# Patient Record
Sex: Female | Born: 1950 | Race: Black or African American | Hispanic: No | Smoking: Never smoker
Health system: Southern US, Community
[De-identification: ages and names within clinical notes are randomized; demographics above are authoritative.]

## PROBLEM LIST (undated history)

## (undated) DIAGNOSIS — L509 Urticaria, unspecified: Secondary | ICD-10-CM

## (undated) DIAGNOSIS — E559 Vitamin D deficiency, unspecified: Secondary | ICD-10-CM

## (undated) DIAGNOSIS — E669 Obesity, unspecified: Secondary | ICD-10-CM

## (undated) DIAGNOSIS — D649 Anemia, unspecified: Secondary | ICD-10-CM

## (undated) DIAGNOSIS — K219 Gastro-esophageal reflux disease without esophagitis: Secondary | ICD-10-CM

## (undated) DIAGNOSIS — E785 Hyperlipidemia, unspecified: Secondary | ICD-10-CM

## (undated) DIAGNOSIS — Z780 Asymptomatic menopausal state: Secondary | ICD-10-CM

## (undated) HISTORY — DX: Vitamin D deficiency, unspecified: E55.9

## (undated) HISTORY — PX: TONSILLECTOMY: SUR1361

## (undated) HISTORY — PX: COLONOSCOPY: SHX174

## (undated) HISTORY — DX: Obesity, unspecified: E66.9

## (undated) HISTORY — DX: Asymptomatic menopausal state: Z78.0

## (undated) HISTORY — DX: Hyperlipidemia, unspecified: E78.5

## (undated) HISTORY — PX: KNEE SURGERY: SHX244

## (undated) HISTORY — PX: APPENDECTOMY: SHX54

## (undated) HISTORY — PX: CYSTECTOMY: SUR359

## (undated) HISTORY — DX: Gastro-esophageal reflux disease without esophagitis: K21.9

## (undated) HISTORY — PX: WISDOM TOOTH EXTRACTION: SHX21

## (undated) HISTORY — DX: Anemia, unspecified: D64.9

## (undated) HISTORY — DX: Urticaria, unspecified: L50.9

---

## 1998-09-16 ENCOUNTER — Other Ambulatory Visit: Admission: RE | Admit: 1998-09-16 | Discharge: 1998-09-16 | Payer: Self-pay | Admitting: Obstetrics and Gynecology

## 1999-10-27 ENCOUNTER — Other Ambulatory Visit: Admission: RE | Admit: 1999-10-27 | Discharge: 1999-10-27 | Payer: Self-pay | Admitting: Family Medicine

## 1999-12-07 ENCOUNTER — Encounter: Admission: RE | Admit: 1999-12-07 | Discharge: 1999-12-07 | Payer: Self-pay | Admitting: Family Medicine

## 1999-12-07 ENCOUNTER — Encounter: Payer: Self-pay | Admitting: Family Medicine

## 2000-01-03 ENCOUNTER — Ambulatory Visit (HOSPITAL_COMMUNITY): Admission: RE | Admit: 2000-01-03 | Discharge: 2000-01-03 | Payer: Self-pay | Admitting: Gastroenterology

## 2000-12-12 ENCOUNTER — Encounter: Admission: RE | Admit: 2000-12-12 | Discharge: 2000-12-12 | Payer: Self-pay | Admitting: Family Medicine

## 2000-12-12 ENCOUNTER — Encounter: Payer: Self-pay | Admitting: Family Medicine

## 2002-01-15 ENCOUNTER — Encounter: Payer: Self-pay | Admitting: Family Medicine

## 2002-01-15 ENCOUNTER — Encounter: Admission: RE | Admit: 2002-01-15 | Discharge: 2002-01-15 | Payer: Self-pay | Admitting: Family Medicine

## 2003-01-13 ENCOUNTER — Encounter: Admission: RE | Admit: 2003-01-13 | Discharge: 2003-04-13 | Payer: Self-pay | Admitting: *Deleted

## 2003-02-10 ENCOUNTER — Encounter: Payer: Self-pay | Admitting: *Deleted

## 2003-02-10 ENCOUNTER — Encounter: Admission: RE | Admit: 2003-02-10 | Discharge: 2003-02-10 | Payer: Self-pay | Admitting: *Deleted

## 2004-03-31 ENCOUNTER — Encounter: Admission: RE | Admit: 2004-03-31 | Discharge: 2004-03-31 | Payer: Self-pay | Admitting: Internal Medicine

## 2004-07-09 ENCOUNTER — Emergency Department (HOSPITAL_COMMUNITY): Admission: EM | Admit: 2004-07-09 | Discharge: 2004-07-09 | Payer: Self-pay | Admitting: Emergency Medicine

## 2005-06-14 ENCOUNTER — Encounter: Admission: RE | Admit: 2005-06-14 | Discharge: 2005-06-14 | Payer: Self-pay | Admitting: Internal Medicine

## 2006-06-10 ENCOUNTER — Emergency Department (HOSPITAL_COMMUNITY): Admission: EM | Admit: 2006-06-10 | Discharge: 2006-06-10 | Payer: Self-pay | Admitting: Family Medicine

## 2006-06-20 ENCOUNTER — Encounter: Admission: RE | Admit: 2006-06-20 | Discharge: 2006-06-20 | Payer: Self-pay | Admitting: Internal Medicine

## 2006-08-25 ENCOUNTER — Emergency Department (HOSPITAL_COMMUNITY): Admission: EM | Admit: 2006-08-25 | Discharge: 2006-08-25 | Payer: Self-pay | Admitting: Emergency Medicine

## 2006-08-27 ENCOUNTER — Ambulatory Visit: Payer: Self-pay | Admitting: Gastroenterology

## 2006-10-01 ENCOUNTER — Ambulatory Visit: Payer: Self-pay | Admitting: Gastroenterology

## 2007-02-20 ENCOUNTER — Emergency Department (HOSPITAL_COMMUNITY): Admission: EM | Admit: 2007-02-20 | Discharge: 2007-02-20 | Payer: Self-pay | Admitting: Emergency Medicine

## 2007-06-25 ENCOUNTER — Encounter: Admission: RE | Admit: 2007-06-25 | Discharge: 2007-06-25 | Payer: Self-pay | Admitting: Obstetrics and Gynecology

## 2007-07-14 ENCOUNTER — Emergency Department (HOSPITAL_COMMUNITY): Admission: EM | Admit: 2007-07-14 | Discharge: 2007-07-14 | Payer: Self-pay | Admitting: Family Medicine

## 2007-11-19 ENCOUNTER — Ambulatory Visit (HOSPITAL_COMMUNITY): Admission: RE | Admit: 2007-11-19 | Discharge: 2007-11-19 | Payer: Self-pay | Admitting: Specialist

## 2008-04-09 ENCOUNTER — Ambulatory Visit (HOSPITAL_COMMUNITY): Admission: RE | Admit: 2008-04-09 | Discharge: 2008-04-09 | Payer: Self-pay | Admitting: Internal Medicine

## 2008-06-25 ENCOUNTER — Encounter: Admission: RE | Admit: 2008-06-25 | Discharge: 2008-06-25 | Payer: Self-pay | Admitting: Internal Medicine

## 2008-07-31 IMAGING — CT CT HEAD W/O CM
4 series · 18 of 40 positions shown, 19 images · non-contrast
Comparison: none

CLINICAL DATA: Fell and hit head.  
 HEAD CT WITHOUT CONTRAST ? 02/20/07:
TECHNIQUE: Contiguous axial CT images were obtained from the base of the skull through the vertex according to standard protocol without contrast.   
 No comparison.
TECHNIQUE: Multidetector CT imaging of the cervical spine was performed according to standard protocol. Multiplanar CT image reconstructions were also generated. 

[Series 2: trauma head · axial · 0.47mm/px · z∈[-103,-6]mm · 4 of 32 slices shown, 5 images]
[im 7/32  brain]
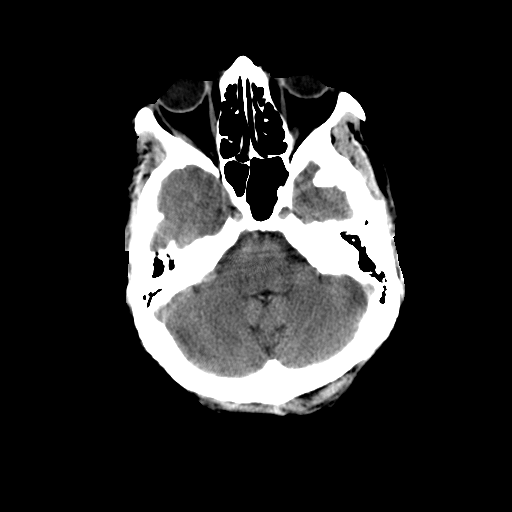
[im 7/32  bone]
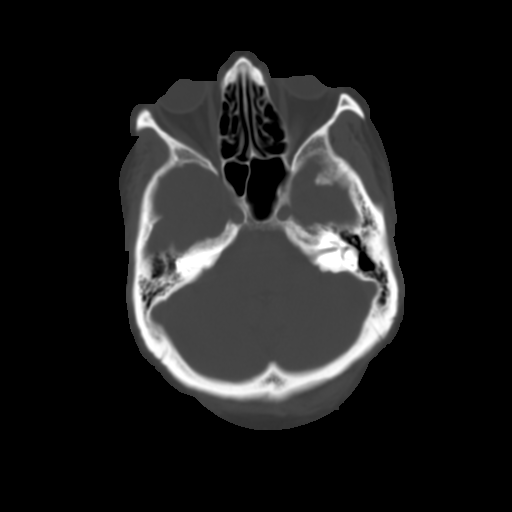
[im 13/32  brain]
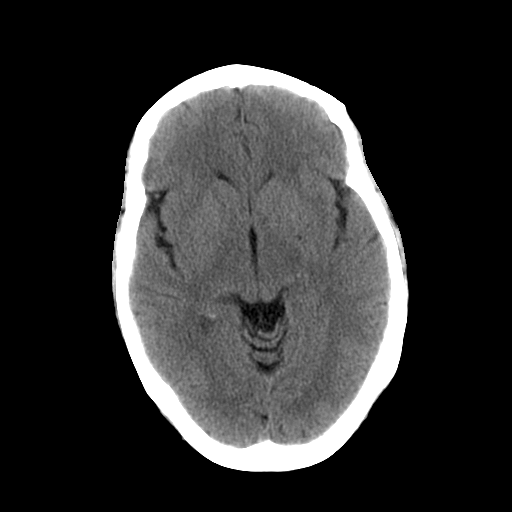
[im 19/32  brain]
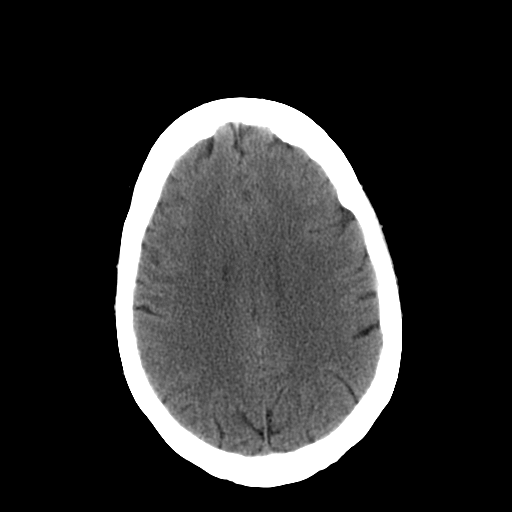
[im 25/32  brain]
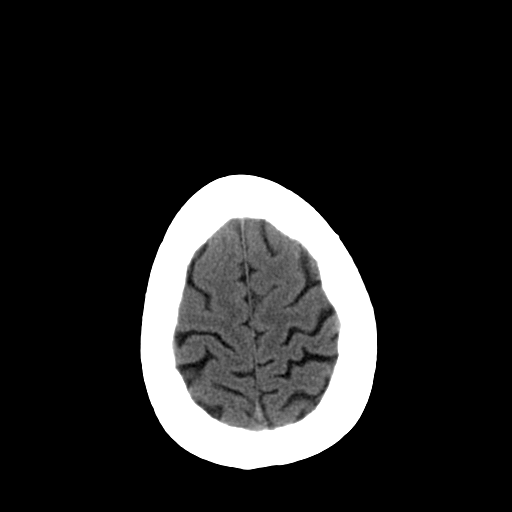

[Series 4: helical c-spine · axial · 0.27mm/px · z∈[-294,-259]mm · 3 of 59 slices shown]
[im 6/59  brain]
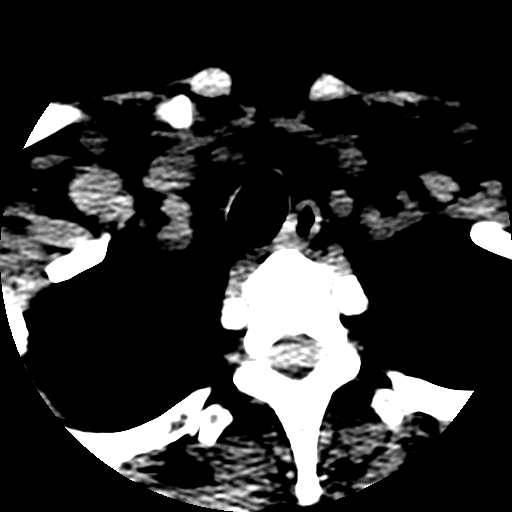
[im 12/59  brain]
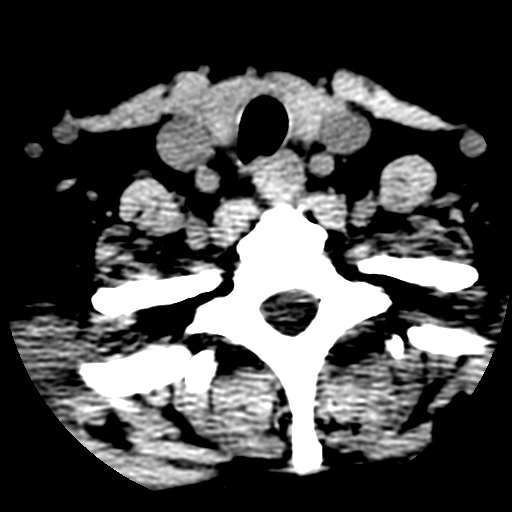
[im 18/59  brain]
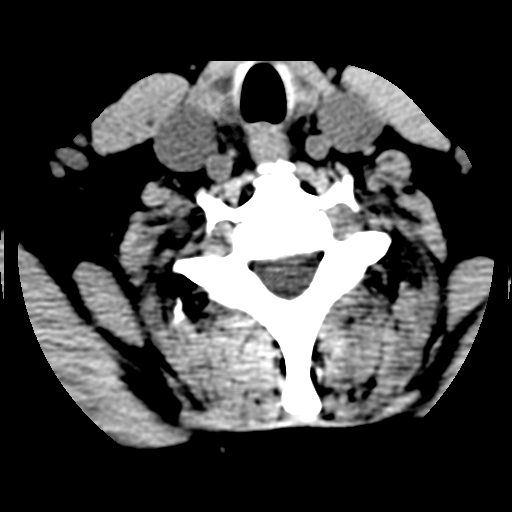

[Series 106: reformatted · coronal · 0.30mm/px · 3 of 67 slices shown (1 of 2)]
[im 23/67  brain]
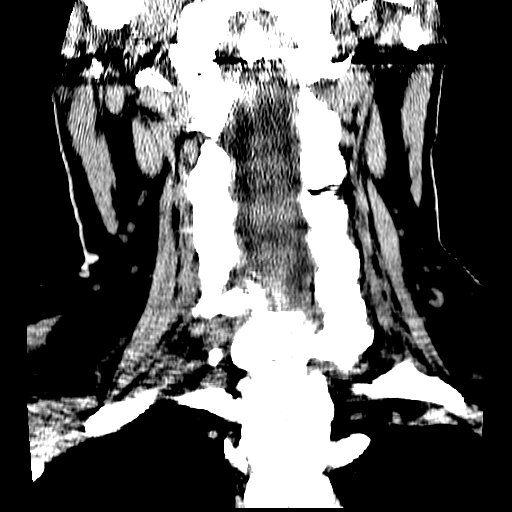
[im 30/67  brain]
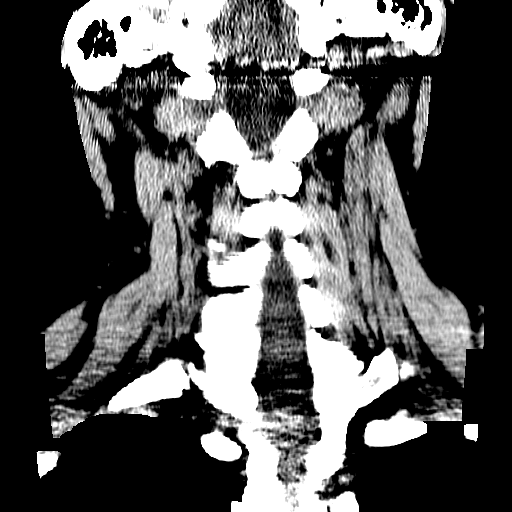
[im 37/67  brain]
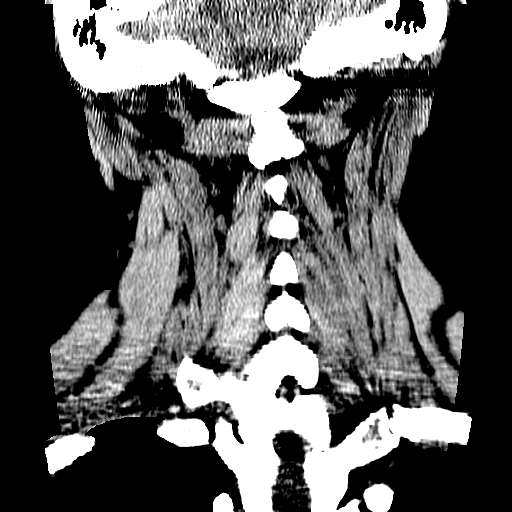

[Series 107: reformatted · sagittal · 0.30mm/px · 8 of 67 slices shown (2 of 2)]
[im 6/67  brain]
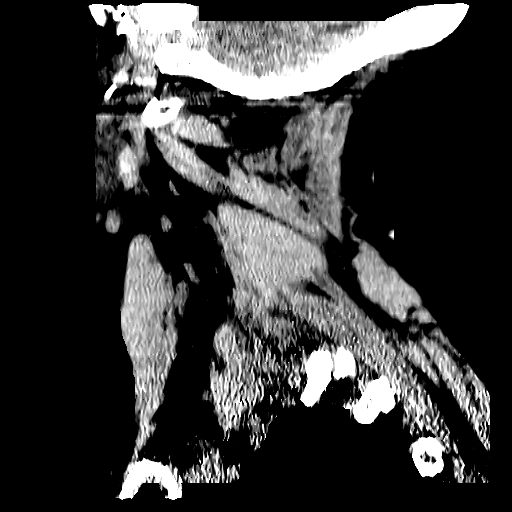
[im 17/67  brain]
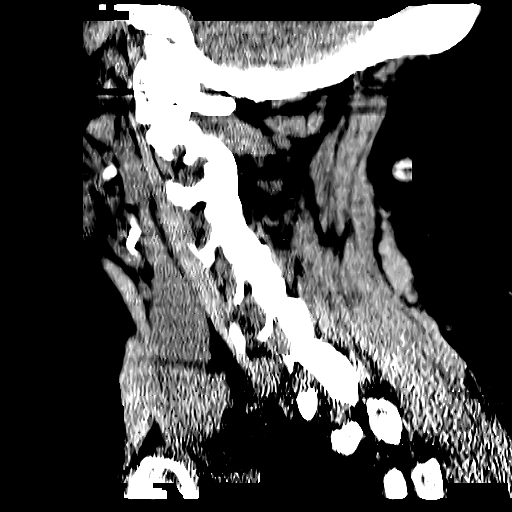
[im 23/67  brain]
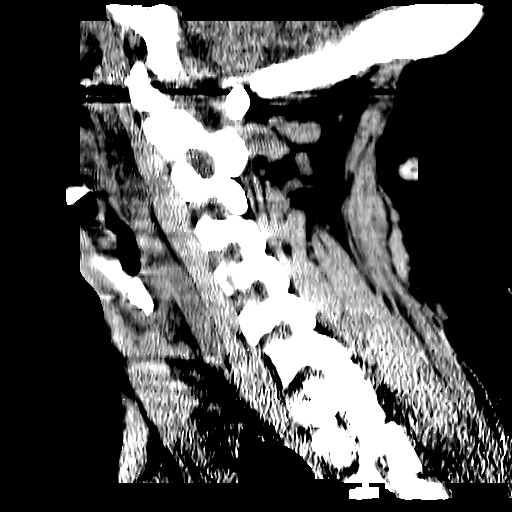
[im 28/67  brain]
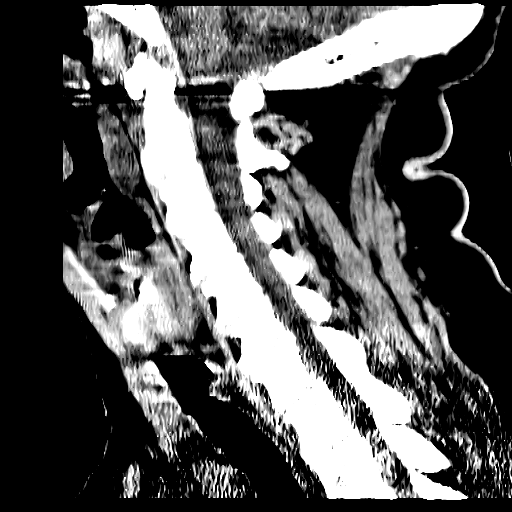
[im 39/67  brain]
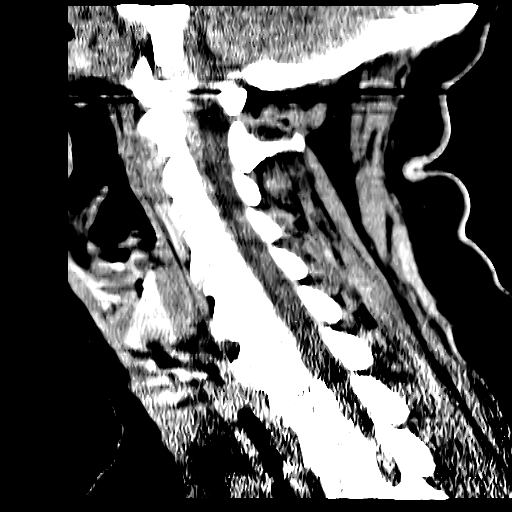
[im 45/67  brain]
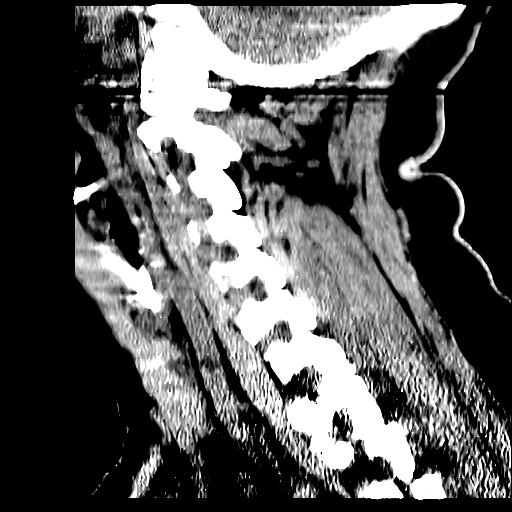
[im 50/67  brain]
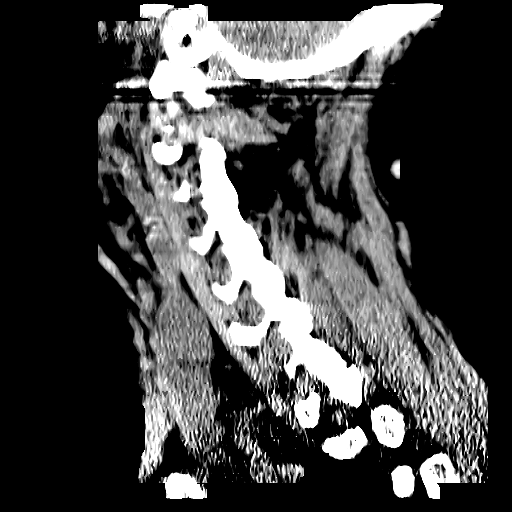
[im 61/67  brain]
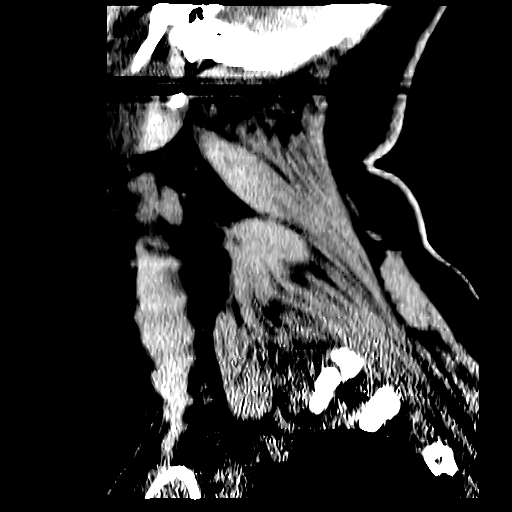

[18 of 40 positions shown; findings below may reference images not displayed]

FINDINGS: There is no evidence of intracranial hemorrhage, brain edema, acute infarct, mass lesion, or mass effect.  No other intra-axial abnormalities are seen, and the ventricles are within normal limits.  No abnormal extra-axial fluid collections or masses are identified.  No skull abnormalities are noted.
IMPRESSION: Negative non-contrast head CT.
 CERVICAL SPINE CT WITHOUT CONTRAST ? 02/20/07:
FINDINGS: Normal alignment and no fracture.   The disk spaces are well maintained.  No significant spondylosis is identified.  There is mild anterior spurring at C5-6, C6-7, and C1-2.
IMPRESSION: No acute abnormality.

## 2009-06-27 ENCOUNTER — Encounter: Admission: RE | Admit: 2009-06-27 | Discharge: 2009-06-27 | Payer: Self-pay | Admitting: Internal Medicine

## 2010-04-07 ENCOUNTER — Ambulatory Visit: Payer: Self-pay | Admitting: Family Medicine

## 2010-04-20 ENCOUNTER — Ambulatory Visit: Payer: Self-pay | Admitting: Family Medicine

## 2010-05-18 ENCOUNTER — Ambulatory Visit: Payer: Self-pay | Admitting: Family Medicine

## 2010-06-26 ENCOUNTER — Ambulatory Visit: Payer: Self-pay | Admitting: Family Medicine

## 2010-06-29 ENCOUNTER — Encounter: Admission: RE | Admit: 2010-06-29 | Discharge: 2010-06-29 | Payer: Self-pay | Admitting: Family Medicine

## 2010-06-29 LAB — HM MAMMOGRAPHY: HM Mammogram: NEGATIVE

## 2010-07-25 ENCOUNTER — Ambulatory Visit: Payer: Self-pay | Admitting: Family Medicine

## 2010-08-25 ENCOUNTER — Encounter: Payer: Self-pay | Admitting: Gastroenterology

## 2010-11-01 ENCOUNTER — Ambulatory Visit: Payer: Self-pay | Admitting: Family Medicine

## 2010-12-28 NOTE — Letter (Signed)
Summary: Colonoscopy-Changed to Office Visit Letter   Gastroenterology  [address] [place], [place] [address]   Phone: [phone]  Fax: [phone]      August 25, 2010 MRN: [id number]   [place] [address] [place], [place]  [address]   Dear Ms. Edelman,   According to our records, it is time for you to schedule a Colonoscopy. However, after reviewing your medical record, I feel that an office visit would be most appropriate to more completely evaluate you and determine your need for a repeat procedure.  Please call [phone] (option #2) at your convenience to schedule an office visit. If you have any questions, concerns, or feel that this letter is in error, we would appreciate your call.   Sincerely,  Rachael Fee, M.D.  [facility]astroenter[facility] [phone]

## 2011-04-10 NOTE — Op Note (Signed)
NAME:  Barbara [name], Barbara [name]              ACCOUNT NO.:  [id number]   MEDICAL RECORD NO.:  [id number]          PATIENT TYPE:  AMB   LOCATION:  DAY                          FACILITY:  [facility]   PHYSICIAN:  Jene Every, M.D.    DATE OF BIRTH:  06/19/1951   DATE OF PROCEDURE:  11/19/2007  DATE OF DISCHARGE:                               OPERATIVE REPORT   PREOPERATIVE DIAGNOSIS:  Lateral meniscal tear right knee.   POSTOPERATIVE DIAGNOSIS:  Lateral meniscal tear right knee. Extensive  grade 3 changes of the femoral condyle laterally posterior medial  meniscus tear.   PROCEDURE PERFORMED:  Right knee arthroscopy, partial lateral  meniscectomy and partial medial meniscectomy, chondroplasty of femoral  condyle.   ANESTHESIA:  General.   ASSISTANT:  No assistant.   BRIEF HISTORY:  The patient is a 60 year old with refractory knee pain.  MRI indicating significant degenerative changes of the lateral  compartment, lateral meniscus tear displaced anteriorly. Indicated for  diagnosis, treatment and debridement and partial meniscectomy. The risks  and benefits discussed of bleeding, infection, damage to vascular  structures, __________  need for debridement, etc.   The patient was placed in the supine position after induction of general  anesthesia and 400 mg of  IV Cipro. The right lower extremity was  prepped and draped in the usual sterile fashion. A lateral parapatellar  portal and the superior medial parapatellar portals were fashioned. A  #11 blade. __________  utilized and displaced the joint under excellent  sedation. Medial parapatellar portal was fashioned with the #11 blade  after immobilization with an 18 gauge needle sparing the  medial  meniscus.  Noted as a displaced anterior horn lateral meniscus tear.  Basket rongeurs utilized to resect this and this was retrieved with a  __________  shaver. Further contoured onto the midway portion of the  meniscus. This was further contoured  with an ArthroWand. There were  extensive grade 3 changes of the femoral condyle and grade 4 changes of  chondral flap tear and loose cartilaginous debris.  Chondroplasty was  performed here. The loose cartilage was debrided and removed.  The  remainder of the meniscus was stable to probe palpation, but  significantly deficient anteriorly.  Mild degenerative changes of the  ACL and this was debrided. The medial compartment revealed posterior  small radial tears of the  medial meniscus. This was freed with a  straight basket rongeur. This was resected with a straight basket  rongeur. The remainder was stable to probe palpation and the  patellofemoral joint with mild degenerative changes. Normal  patellofemoral tracking. The gutters were unremarkable. I re-examined  the medial compartment. Extensive weight bearing grade 3 and grade 4  changes were still noted. It was felt that will require total knee  arthroplasty in the future at this point.   Again, the knee was copiously lavaged. The lateral meniscus was re-  evaluated and it was stable to palpation. All  instrumentation was  removed. Portals were closed with 4-0 and running simple sutures.  Quarter percent Marcaine  with Epinephrine was infiltrated in the joint. The wound was  dressed  sterilely. She was awakened without difficulty and transported to the  recovery room in satisfactory condition. The patient tolerated the  procedure well. There were no complications.      Jene Every, M.D.  Electronically Signed     JB/MEDQ  D:  11/19/2007  T:  11/19/2007  Job:  [id number]

## 2011-04-13 NOTE — Assessment & Plan Note (Signed)
[facility]                           GASTROENTEROLOGY OFFICE NOTE   NAME:Barbara [name], Barbara [name]                       MRN:          [id number]  DATE:08/27/2006                            DOB:          1951-09-20    New GI patient evaluation. The patient was self-referred. Her primary care  doctor is Dr. Ambrose Mantle.   CHIEF COMPLAINT:  My doctor felt something in my rectum.   HISTORY OF PRESENT ILLNESS:  Barbara [name] is a pleasant 60 year old woman who  tells me she underwent a colonoscopy one year ago by Dr. Vilinda Boehringer at  [facility]. She believes this was normal. She for the past year  or so has had two to three loose stools on a daily basis with some mild  cramping with her bowel movements. She tells me she underwent physical exam  by her primary care doctor, and they told her they felt a mass in her  rectum. She was told to get this evaluated. She has had issues with billing  at [facility],and therefore wanted to switch providers.   We have asked for the records from [facility] to be sent over.  They are unable to locate her chart at this time. I find no dictation of a  colonoscopy in the echart system and no pathology reports in echart, either.   REVIEW OF SYSTEMS:  As notable for 4- to 5-pound weight gain in the past  year. No hematochezia. No melena. The rest of her review of systems is  essentially normal and is available on her nursing intake sheet.   PAST MEDICAL HISTORY:  1. Type 2 diabetes.  2. Elevated cholesterol.  3. Obesity.   CURRENT MEDICATIONS:  1. Metformin.  2. Amaryl.   ALLERGIES:  No known drug allergies.   SOCIAL HISTORY:  Divorced, two children, nonsmoker, nondrinker.   FAMILY HISTORY:  Her brother died at age 54 from colon cancer. Sister is in  her 78s with colon cancer.   PHYSICAL EXAMINATION:  VITAL SIGNS:  5 feet 4 inches, 209 pounds. Blood  pressure 114/68, pulse 72.  CONSTITUTIONAL:  Obese, otherwise generally well appearing.  NEUROLOGICAL:  Alert and oriented x3.  HEENT:  Eyes:  Extraocular movements intact. Mouth:  Oropharynx moist. No  lesions.  NECK:  Supple with no lymphadenopathy.  CARDIOVASCULAR:  Heart regular rate and rhythm.  LUNGS:  Clear to auscultation bilaterally.  ABDOMEN:  Soft, nontender, nondistended. Normal bowel sounds.  EXTREMITIES:  No lower extremity edema.  SKIN:  No rashes or lesions of distal extremities.  RECTAL:  Examination performed with female CMA in room was normal. No masses  palpated.   ASSESSMENT AND PLAN:  A 60 year old woman with question rectal mass on  recent exam by other Alonza Knisley, strong family history of colon cancer, recent  loose stools.   First, her relatively loose stool may be from her diabetes medicine. She is  on 2 g of metformin daily, and this is not an uncommon side effect of  metformin at this dose. Recommend that she discuss this with her primary  care physician and consider  cutting back the metformin. I will leave this to  him since obviously her other diabetes medicines will be have to be  adjusted.   As far as this question of a rectal mass, I did not feel anything on my  rectal examination. However, she tells me very clearly that another care  Lileigh Fahringer recently did feel something. Given her very strong family history,  I think we should rescope her at her soonest convenience. I am also having  trouble getting her colonoscopy report sent over from [facility]. We will continue to try to do this but will in the  meantime plan for a colonoscopy to be done at her soonest convenience. I  will also get CBC and complete metabolic profile performed today.            ______________________________  Rachael Fee, MD      DPJ/MedQ  DD:  08/27/2006  DT:  08/28/2006  Job #:  [id number]

## 2011-06-04 ENCOUNTER — Other Ambulatory Visit: Payer: Self-pay | Admitting: Family Medicine

## 2011-06-04 DIAGNOSIS — Z1231 Encounter for screening mammogram for malignant neoplasm of breast: Secondary | ICD-10-CM

## 2011-07-02 ENCOUNTER — Ambulatory Visit
Admission: RE | Admit: 2011-07-02 | Discharge: 2011-07-02 | Disposition: A | Discharge status: Home / Self Care | Payer: 59 | Source: Ambulatory Visit | Attending: Family Medicine | Admitting: Family Medicine

## 2011-07-02 DIAGNOSIS — Z1231 Encounter for screening mammogram for malignant neoplasm of breast: Secondary | ICD-10-CM

## 2011-08-24 ENCOUNTER — Encounter: Payer: Self-pay | Admitting: Gastroenterology

## 2011-08-31 LAB — BASIC METABOLIC PANEL
BUN: 13
CO2: 28
Calcium: 8.7
Chloride: 110
GFR calc non Af Amer: >60
Glucose, Bld: 152 — ABNORMAL HIGH

## 2011-08-31 LAB — URINALYSIS, ROUTINE W REFLEX MICROSCOPIC
Glucose, UA: NEGATIVE
Ketones, ur: NEGATIVE
pH: 5.5

## 2011-08-31 LAB — URINE MICROSCOPIC-ADD ON

## 2011-08-31 LAB — HEMOGLOBIN AND HEMATOCRIT, BLOOD: HCT: 31.3 — ABNORMAL LOW

## 2011-09-25 ENCOUNTER — Encounter: Payer: Self-pay | Admitting: Gastroenterology

## 2011-09-25 ENCOUNTER — Ambulatory Visit (INDEPENDENT_AMBULATORY_CARE_PROVIDER_SITE_OTHER): Payer: 59 | Admitting: Gastroenterology

## 2011-09-25 DIAGNOSIS — Z809 Family history of malignant neoplasm, unspecified: Secondary | ICD-10-CM | POA: Insufficient documentation

## 2011-09-25 MED ORDER — PEG-KCL-NACL-NASULF-NA ASC-C 100 G PO SOLR: 1.0000 | ORAL | Status: DC

## 2011-09-25 NOTE — Patient Instructions (Addendum)
You will be set up for a colonoscopy at [facility] with propofol due to incomplete procedure in 2007.  Please arrive at 11 am on 11/21/11 at [facility] for a pre appointment with anesthesia.

## 2011-09-25 NOTE — Progress Notes (Signed)
  HPI: This is a  very pleasant 60 year old woman whom I last saw in 2007.  She had colonoscopy 2006 (edwards) that was normal.  PCP felt a lump in her rectum in 2007, I repeated lower endoscopy in 2007 (flex sigmoidoscopy) that was normal (had very tortuous colon, could not pass into proximal colon).   No real bowel troubles.  Continued relatively loose stools with metformin.  No serious medical issues in the past several years.  Overall she has lost a few pounds in the past year, intentionally.   Review of systems: Pertinent positive and negative review of systems were noted in the above HPI section. Complete review of systems was performed and was otherwise normal.    Past Medical History  Diagnosis Date  . Diabetes mellitus   . Hyperlipidemia   . Obesity     Past Surgical History  Procedure Date  . Appendectomy   . Cystectomy     Rt falopian tube    Current Outpatient Prescriptions  Medication Sig Dispense Refill  . metFORMIN (GLUMETZA) 1000 MG (MOD) 24 hr tablet Take 1,000 mg by mouth 2 (two) times daily with a meal.          Allergies as of 09/25/2011  . (No Known Allergies)    Family History  Problem Relation Age of Onset  . Colon cancer Brother 22  . Colon cancer Sister 57  . Heart disease Mother   . Heart disease Father     History   Social History  . Marital Status: Divorced    Spouse Name: N/A    Number of Children: 2  . Years of Education: N/A   Occupational History  . School Midwife    Social History Main Topics  . Smoking status: Never Smoker   . Smokeless tobacco: Never Used  . Alcohol Use: No  . Drug Use: No  . Sexually Active: Not on file   Other Topics Concern  . Not on file   Social History Narrative  . No narrative on file       Physical Exam: BP 106/68  Pulse 80  Ht 5\' 4"  (1.626 m)  Wt 222 lb (100.699 kg)  BMI 38.11 kg/m2 Constitutional: generally well-appearing Psychiatric: alert and oriented x3 Eyes:  extraocular movements intact Mouth: oral pharynx moist, no lesions Neck: supple no lymphadenopathy Cardiovascular: heart regular rate and rhythm Lungs: clear to auscultation bilaterally Abdomen: soft, nontender, nondistended, no obvious ascites, no peritoneal signs, normal bowel sounds Extremities: no lower extremity edema bilaterally Skin: no lesions on visible extremities    Assessment and plan: 60 y.o. female with  strong family history of colon cancer, incomplete colonoscopy 2007  Of note, she was recommended to have repeat colonoscopy about a year ago however she did not respond to our request. She is here today and we will proceed with full colonoscopy at her soonest convenience. I was not able to pass into her proximal colon in 2007 (fortunately she had a full colonoscopy the year prior) and so I want to change up the variables a bit using Pentax equipment instead of Olympus and we will have her sedated with propofol anesthesia to hopefully allow for a more complete examination at this time.

## 2011-10-01 ENCOUNTER — Telehealth: Payer: Self-pay

## 2011-10-01 NOTE — Telephone Encounter (Signed)
Left message on machine to call back  

## 2011-10-03 NOTE — Telephone Encounter (Signed)
Left message on machine to call back  

## 2011-10-03 NOTE — Telephone Encounter (Signed)
Pt was contacted about her appt time change and wanted to put it back to 11/22/11  She was not able to get off for 10/16/11  ENDO called and appt changed back to 11/22/11  Pre appt on 11/21/11

## 2011-10-22 ENCOUNTER — Encounter: Payer: Self-pay | Admitting: Family Medicine

## 2011-10-23 ENCOUNTER — Ambulatory Visit (INDEPENDENT_AMBULATORY_CARE_PROVIDER_SITE_OTHER): Payer: 59 | Admitting: Medical

## 2011-10-23 ENCOUNTER — Encounter: Payer: Self-pay | Admitting: Medical

## 2011-10-23 VITALS — BP 110/80 | HR 60 | Temp 98.1°F | Resp 16 | Wt 216.0 lb

## 2011-10-23 DIAGNOSIS — R059 Cough, unspecified: Secondary | ICD-10-CM | POA: Insufficient documentation

## 2011-10-23 DIAGNOSIS — E119 Type 2 diabetes mellitus without complications: Secondary | ICD-10-CM | POA: Insufficient documentation

## 2011-10-23 DIAGNOSIS — R0602 Shortness of breath: Secondary | ICD-10-CM | POA: Insufficient documentation

## 2011-10-23 DIAGNOSIS — R05 Cough: Secondary | ICD-10-CM

## 2011-10-23 MED ORDER — HYDROCODONE-HOMATROPINE 5-1.5 MG/5ML PO SYRP: 5.0000 mL | ORAL_SOLUTION | Freq: Four times a day (QID) | ORAL | Status: AC | PRN

## 2011-10-23 MED ORDER — CLARITHROMYCIN ER 500 MG PO TB24: 1000.0000 mg | ORAL_TABLET | Freq: Every day | ORAL | Status: AC

## 2011-10-23 NOTE — Progress Notes (Signed)
Subjective:   HPI  Barbara [name] is a 60 y.o. female who presents with cough, x 6 weeks.  She notes cough, productive, slight headache, chest sore from coughing.  Been feeling bad for weeks.  Has had cough on and off for 6 weeks.  Daughter is sick with similar symptom.   Denies fever, sore throat, chills, +sneezing though.   Using Mucinex DM and Tessalon Perles.  Went to [facility] clinic at [facility], gave her Tessalon and inhaler.  Using inhaler BID.  Denies hx/o asthma, is nonsmoker.  No other aggravating or relieving factors.  She went on a cruise in August, but denies recent procedure ,surgery, bed rest, HRT, no hx/o DVT or PE. Denies chest pain, palpitations, edema, no calve pain.  She is UTD on flu vaccine.  No other c/o.  The following portions of the patient's history were reviewed and updated as appropriate: allergies, current medications, past family history, past medical history, past social history, past surgical history and problem list.  Past Medical History  Diagnosis Date  . Hyperlipidemia   . Obesity   . Diabetes mellitus     TYPE 2  . Postmenopausal   . Vitamin D deficiency disease   . Anemia    Review of System+ Constitutional: -fever, -chills, -sweats, -unexpected -weight change,-fatigue ENT: +runny nose, +ear pain, +sore throat Cardiology:  +chest pain, -palpitations, -edema Respiratory: +cough, -shortness of breath, +wheezing Gastroenterology: -abdominal pain, -nausea, -vomiting, -diarrhea, -constipation Hematology: -bleeding or bruising problems Musculoskeletal: -arthralgias, -myalgias, -joint swelling, -back pain Ophthalmology: -vision changes Urology: -dysuria, -difficulty urinating, -hematuria, -urinary frequency, -urgency Neurology: +headache, -weakness, -tingling, -numbness     Objective:   Physical Exam  Filed Vitals:   10/23/11 1007  BP: 110/80  Pulse: 60  Temp: 98.1 F (36.7 C)  Resp: 16    General appearance: alert, no distress, WD/WN, black  female Skin: warm, moist HEENT: normocephalic, sclerae anicteric, TMs pearly, nares with turbinate edema, clear discharge, pharynx normal Oral cavity: MMM, no lesions Neck: supple, no lymphadenopathy, no thyromegaly, no masses, no JVD Heart: RRR, normal S1, S2, no murmurs Lungs: CTA bilaterally, no wheezes, rhonchi, or rales Pulses: 2+ symmetric, upper and lower extremities, normal cap refill Ext: no edema, no cyanosis or clubbing No calve pain  Assessment and Plan :    Encounter Diagnoses  Name Primary?  . Cough Yes  . Shortness of breath   . Type II or unspecified type diabetes mellitus without mention of complication, not stated as uncontrolled    Chest xray today with mild hilar congestion, otherwise no acute changes.  Will send for radiology over read.  Pulse ox normal.  Script today for Hycodan and Biaxin.  Rest, hydrate well, can c/t OTC Mucinex.  Call/return if worse or not improving by end of the week. follow-up 1 wk.

## 2011-10-23 NOTE — Patient Instructions (Signed)
Begin Biaxin antibiotic, 2 tablets daily, you can use the Hycodan syrup, 1 tsp every 6 hours for cough, rest, hydrate well.   You can also use OTC Mucinex DM.   If not improving in 2-3 days let me know.    Acute Bronchitis Bronchitis is a problem of the air tubes leading to your lungs. Acute means the illness started quickly. In this condition, the lining of those tubes becomes puffy (swollen) and can leak fluid. This makes it harder for air to get in and out of your lungs. You may cough a lot. This is because the air tubes are narrow. Bronchitis is most often caused by a virus. Medicines that kill germs (antibiotics) may be needed with germ (bacteria) infections for people who:  Smoke.   Have lasting (chronic) lung problems.   Are elderly.  HOME CARE  Rest.   Drink enough water and fluids to keep the pee clear or pale yellow.   Only take medicine as told by your doctor.   Medicines may be prescribed that will open up the airways. This will help make breathing easier.   Bronchitis usually gets better on its own in a few days.  Recovery from some problems (symptoms) of bronchitis may be slow. You should start feeling a little better after 2 to 3 days. Coughing may last for 3 to 4 weeks. GET HELP RIGHT AWAY IF:  You or your child has a temperature by mouth above 101, not controlled by medicine.   Chills or chest pain develops.   You or your child develops very bad shortness of breath.   There is bloody saliva mixed with mucus (sputum).   You or your child throws up (vomits) often, loses too much fluid (dehydration), feels faint, or has a very bad headache.   You or your child does not improve after 1 week of treatment.  MAKE SURE YOU:   Understand these instructions.   Will watch this condition.   Will get help right away if you or your child is not doing well or gets worse.  Document Released: 04/30/2008 Document Re-Released: 02/06/2010 [facility] Patient Information 2011  [place], [place].

## 2011-10-29 ENCOUNTER — Ambulatory Visit: Payer: 59 | Admitting: Medical

## 2011-10-30 ENCOUNTER — Encounter: Payer: Self-pay | Admitting: Medical

## 2011-10-30 ENCOUNTER — Ambulatory Visit (INDEPENDENT_AMBULATORY_CARE_PROVIDER_SITE_OTHER): Payer: 59 | Admitting: Medical

## 2011-10-30 VITALS — BP 120/80 | HR 62 | Temp 98.2°F | Resp 16 | Wt 215.0 lb

## 2011-10-30 DIAGNOSIS — E119 Type 2 diabetes mellitus without complications: Secondary | ICD-10-CM

## 2011-10-30 DIAGNOSIS — J40 Bronchitis, not specified as acute or chronic: Secondary | ICD-10-CM

## 2011-10-30 DIAGNOSIS — E669 Obesity, unspecified: Secondary | ICD-10-CM

## 2011-10-30 LAB — CBC
Hemoglobin: 11.5 g/dL — ABNORMAL LOW (ref 12.0–15.0)
RBC: 4.23 MIL/uL (ref 3.87–5.11)

## 2011-10-30 LAB — COMPREHENSIVE METABOLIC PANEL
Alkaline Phosphatase: 93 U/L (ref 39–117)
CO2: 30 mEq/L (ref 19–32)
Calcium: 9.4 mg/dL (ref 8.4–10.5)
Creat: 0.71 mg/dL (ref 0.50–1.10)
Glucose, Bld: 163 mg/dL — ABNORMAL HIGH (ref 70–99)
Total Bilirubin: 0.5 mg/dL (ref 0.3–1.2)

## 2011-10-30 LAB — LIPID PANEL
LDL Cholesterol: 64 mg/dL (ref 0–99)
VLDL: 18 mg/dL (ref 0–40)

## 2011-10-30 LAB — HEMOGLOBIN A1C
Hgb A1c MFr Bld: 8.1 % — ABNORMAL HIGH (ref <5.7)
Mean Plasma Glucose: 186 mg/dL — ABNORMAL HIGH (ref <117)

## 2011-10-30 NOTE — Progress Notes (Signed)
  Subjective:   HPI  Barbara [name] is a 60 y.o. female who presents with recheck on cough.  Last week I put her on Biaxin and Hycodan syrup for cough.  She is also using Mucinex DM OTC.  She does get some short of breath spells with bad cough only.   Denies chest pain or palpitations.  Chest is sore.  She is a nonsmoker. She has the inhaler from [facility], and has only used it 3 times in the last 2 days.   She had flu shot at [facility] recently.   She has never had the pneumococcal vaccine.    She would like me to start working with her in regards to diabetes.  She has been going to the [facility] for this care, but rather someone locally manage this.  She notes that her morning fasting glucose is 140-190s in morning.  She is not exercising.  She was doing some water aerobics prior.   Last eye doctor visit in the summer.  She has been on other medications.  She doesn't like the way Actos made her feel.  She was happy with Victoza but it was starting to cost $300/mo.  She had to borrow money to afford this.  Last diabetes labs early in the year.  The following portions of the patient's history were reviewed and updated as appropriate: allergies, current medications, past family history, past medical history, past social history, past surgical history and problem list.  Past Medical History  Diagnosis Date  . Hyperlipidemia   . Obesity   . Diabetes mellitus     TYPE 2  . Postmenopausal   . Vitamin D deficiency disease   . Anemia    Review of System Constitutional: -fever, -chills, -sweats, -unexpected -weight change,-fatigue ENT: +runny nose, +ear pain, +sore throat Cardiology:  +chest pain, -palpitations, -edema Respiratory: +cough, -shortness of breath, +wheezing Gastroenterology: -abdominal pain, -nausea, -vomiting, -diarrhea, -constipation Hematology: -bleeding or bruising problems Musculoskeletal: -arthralgias, -myalgias, -joint swelling, -back pain Ophthalmology: -vision changes Urology:  -dysuria, -difficulty urinating, -hematuria, -urinary frequency, -urgency Neurology: +headache, -weakness, -tingling, -numbness    Objective:   Physical Exam  Filed Vitals:   10/30/11 1018  BP: 120/80  Pulse: 62  Temp: 98.2 F (36.8 C)  Resp: 16    General appearance: alert, no distress, WD/WN, black female Skin: warm, moist HEENT: normocephalic, sclerae anicteric, TMs pearly, nares with turbinate edema, clear discharge, pharynx normal Oral cavity: MMM, no lesions Neck: supple, no lymphadenopathy, no thyromegaly, no masses, no JVD Heart: RRR, normal S1, S2, no murmurs Lungs: CTA bilaterally, no wheezes, rhonchi, or rales Pulses: 2+ symmetric, upper and lower extremities, normal cap refill Ext: no edema, no cyanosis or clubbing   Assessment and Plan :    Encounter Diagnoses  Name Primary?  . Type II or unspecified type diabetes mellitus without mention of complication, not stated as uncontrolled Yes  . Obesity   . Bronchitis    Type II diabetes - I will start seeing her for diabetes management.  Labs today, c/t routine glucose monitoring at home.  Of note, Victoza was $300/mo.  She is currently just taking Metfromin 1000mg  BID.  Obesity - discussed need to eat healthy, begin some form of exercise and lose weight.  Bronchitis - improving.  Finish antibiotic and use cough medication prn.    Of note, consider pneumococcal and shingles vaccines once she is feeling better.

## 2011-11-01 ENCOUNTER — Other Ambulatory Visit: Payer: Self-pay | Admitting: Medical

## 2011-11-01 MED ORDER — SAXAGLIPTIN HCL 5 MG PO TABS: 5.0000 mg | ORAL_TABLET | Freq: Every day | ORAL | Status: DC

## 2011-11-21 ENCOUNTER — Encounter (HOSPITAL_COMMUNITY): Payer: Self-pay

## 2011-11-21 ENCOUNTER — Encounter (HOSPITAL_COMMUNITY)
Admission: RE | Admit: 2011-11-21 | Discharge: 2011-11-21 | Disposition: A | Discharge status: Home / Self Care | Payer: 59 | Source: Ambulatory Visit | Attending: Gastroenterology | Admitting: Gastroenterology

## 2011-11-21 NOTE — Anesthesia Preprocedure Evaluation (Addendum)
Anesthesia Evaluation  Patient identified by MRN, date of birth, ID band Patient awake  General Assessment Comment:Screening Colonoscopy  Reviewed: Allergy & Precautions, H&P , NPO status , Patient's Chart, lab work & pertinent test results  Airway Mallampati: II TM Distance: >3 FB Neck ROM: Full    Dental No notable dental hx.    Pulmonary neg pulmonary ROS,  clear to auscultation  Pulmonary exam normal       Cardiovascular Regular Normal Denies cardiac symptoms   Neuro/Psych Negative Neurological ROS  Negative Psych ROS   GI/Hepatic negative GI ROS, Neg liver ROS,   Endo/Other  Diabetes mellitus-, Well Controlled, Type 2, Oral Hypoglycemic AgentsNo AM DM meds  Renal/GU negative Renal ROS  Genitourinary negative   Musculoskeletal negative musculoskeletal ROS (+)   Abdominal   Peds negative pediatric ROS (+)  Hematology negative hematology ROS (+)   Anesthesia Other Findings   Reproductive/Obstetrics negative OB ROS                          Anesthesia Physical Anesthesia Plan  ASA: II  Anesthesia Plan: MAC   Post-op Pain Management:    Induction: Intravenous  Airway Management Planned:   Additional Equipment:   Intra-op Plan:   Post-operative Plan: Extubation in OR  Informed Consent: I have reviewed the patients History and Physical, chart, labs and discussed the procedure including the risks, benefits and alternatives for the proposed anesthesia with the patient or authorized representative who has indicated his/her understanding and acceptance.   Dental advisory given  Plan Discussed with: CRNA  Anesthesia Plan Comments:         Anesthesia Quick Evaluation

## 2011-11-22 ENCOUNTER — Encounter (HOSPITAL_COMMUNITY)
Admission: RE | Disposition: A | Discharge status: Home / Self Care | Payer: Self-pay | Source: Ambulatory Visit | Attending: Gastroenterology

## 2011-11-22 ENCOUNTER — Other Ambulatory Visit: Payer: Self-pay | Admitting: Gastroenterology

## 2011-11-22 ENCOUNTER — Encounter (HOSPITAL_COMMUNITY): Payer: Self-pay | Admitting: Anesthesiology

## 2011-11-22 ENCOUNTER — Ambulatory Visit (HOSPITAL_COMMUNITY): Payer: 59 | Admitting: Anesthesiology

## 2011-11-22 ENCOUNTER — Ambulatory Visit (HOSPITAL_COMMUNITY)
Admission: RE | Admit: 2011-11-22 | Discharge: 2011-11-22 | Disposition: A | Discharge status: Home / Self Care | Payer: 59 | Source: Ambulatory Visit | Attending: Gastroenterology | Admitting: Gastroenterology

## 2011-11-22 ENCOUNTER — Encounter (HOSPITAL_COMMUNITY): Payer: Self-pay | Admitting: *Deleted

## 2011-11-22 DIAGNOSIS — E785 Hyperlipidemia, unspecified: Secondary | ICD-10-CM | POA: Insufficient documentation

## 2011-11-22 DIAGNOSIS — Z800 Family history of malignant neoplasm of digestive organs: Secondary | ICD-10-CM

## 2011-11-22 DIAGNOSIS — E669 Obesity, unspecified: Secondary | ICD-10-CM | POA: Insufficient documentation

## 2011-11-22 DIAGNOSIS — Z1211 Encounter for screening for malignant neoplasm of colon: Secondary | ICD-10-CM

## 2011-11-22 DIAGNOSIS — D126 Benign neoplasm of colon, unspecified: Secondary | ICD-10-CM

## 2011-11-22 DIAGNOSIS — D378 Neoplasm of uncertain behavior of other specified digestive organs: Secondary | ICD-10-CM | POA: Insufficient documentation

## 2011-11-22 DIAGNOSIS — D371 Neoplasm of uncertain behavior of stomach: Secondary | ICD-10-CM | POA: Insufficient documentation

## 2011-11-22 DIAGNOSIS — E119 Type 2 diabetes mellitus without complications: Secondary | ICD-10-CM | POA: Insufficient documentation

## 2011-11-22 HISTORY — PX: COLONOSCOPY: SHX5424

## 2011-11-22 SURGERY — COLONOSCOPY
Anesthesia: Monitor Anesthesia Care

## 2011-11-22 MED ORDER — PROMETHAZINE HCL 25 MG/ML IJ SOLN: 6.2500 mg | INTRAMUSCULAR | Status: DC | PRN

## 2011-11-22 MED ORDER — PROPOFOL 10 MG/ML IV EMUL
INTRAVENOUS | Status: DC | PRN
  Administered 2011-11-22: 100 ug/kg/min via INTRAVENOUS

## 2011-11-22 MED ORDER — FENTANYL CITRATE 0.05 MG/ML IJ SOLN
INTRAMUSCULAR | Status: DC | PRN
  Administered 2011-11-22: 50 ug via INTRAVENOUS

## 2011-11-22 MED ORDER — MIDAZOLAM HCL 5 MG/5ML IJ SOLN
INTRAMUSCULAR | Status: DC | PRN
  Administered 2011-11-22: 2 mg via INTRAVENOUS

## 2011-11-22 MED ORDER — LACTATED RINGERS IV SOLN
INTRAVENOUS | Status: DC | PRN
  Administered 2011-11-22: 07:00:00 via INTRAVENOUS

## 2011-11-22 MED ORDER — SODIUM CHLORIDE 0.9 % IV SOLN
INTRAVENOUS | Status: DC
  Administered 2011-11-22: 1000 mL via INTRAVENOUS

## 2011-11-22 MED ORDER — FENTANYL CITRATE 0.05 MG/ML IJ SOLN: 25.0000 ug | INTRAMUSCULAR | Status: DC | PRN

## 2011-11-22 MED ORDER — ONDANSETRON HCL 4 MG/2ML IJ SOLN
INTRAMUSCULAR | Status: DC | PRN
  Administered 2011-11-22: 4 mg via INTRAVENOUS

## 2011-11-22 NOTE — Preoperative (Signed)
Beta Blockers   Reason not to administer Beta Blockers:Not Applicable 

## 2011-11-22 NOTE — H&P (Signed)
  HPI: This is a woman here for colonoscopy, polyp surrviellance.    Past Medical History  Diagnosis Date  . Hyperlipidemia   . Obesity   . Diabetes mellitus     TYPE 2  . Postmenopausal   . Vitamin D deficiency disease   . Anemia     Past Surgical History  Procedure Date  . Appendectomy   . Cystectomy     Rt falopian tube    Current Facility-Administered Medications  Medication Dose Route Frequency Provider Last Rate Last Dose  . 0.9 %  sodium chloride infusion   Intravenous Continuous Rob Bunting, MD 20 mL/hr at 11/22/11 0724 1,000 mL at 11/22/11 0724    Allergies as of 09/25/2011  . (No Known Allergies)    Family History  Problem Relation Age of Onset  . Colon cancer Brother 46  . Cancer Brother     COLON  . Diabetes Brother   . Colon cancer Sister 61  . Cancer Sister     COLON  . Diabetes Sister   . Heart disease Mother     HEART ATTACK  . Heart disease Father     HEART ATTACK  . Anesthesia problems Neg Hx   . Malignant hyperthermia Neg Hx     History   Social History  . Marital Status: Divorced    Spouse Name: N/A    Number of Children: 2  . Years of Education: N/A   Occupational History  . School Midwife    Social History Main Topics  . Smoking status: Never Smoker   . Smokeless tobacco: Never Used  . Alcohol Use: No  . Drug Use: No  . Sexually Active: Not on file   Other Topics Concern  . Not on file   Social History Narrative  . No narrative on file      Physical Exam: BP 137/79  Pulse 60  Temp(Src) 98.1 F (36.7 C) (Oral)  Resp 22  Ht 5\' 4"  (1.626 m)  Wt 98.884 kg (218 lb)  BMI 37.42 kg/m2  SpO2 98% Constitutional: generally well-appearing Psychiatric: alert and oriented x3 Abdomen: soft, nontender, nondistended, no obvious ascites, no peritoneal signs, normal bowel sounds     Assessment and plan: 60 y.o. female with history of polyps  Colonoscopy today

## 2011-11-22 NOTE — Anesthesia Postprocedure Evaluation (Signed)
  Anesthesia Post-op Note  Patient: Barbara [name]  Procedure(s) Performed:  COLONOSCOPY  Patient Location: PACU  Anesthesia Type: MAC  Level of Consciousness: awake and oriented  Airway and Oxygen Therapy: Patient Spontanous Breathing  Post-op Pain: none  Post-op Assessment: Post-op Vital signs reviewed, Patient's Cardiovascular Status Stable, Respiratory Function Stable and Patent Airway  Post-op Vital Signs: stable  Complications: No apparent anesthesia complications

## 2011-11-22 NOTE — Op Note (Signed)
[facility] [address] [place], [place]  [address]  COLONOSCOPY PROCEDURE REPORT  PATIENT:  [name], Barbara  MR#:  [id number] BIRTHDATE:  1951/04/25, 60 yrs. old  GENDER:  female ENDOSCOPIST:  Rachael Fee, MD PROCEDURE DATE:  11/22/2011 PROCEDURE:  Colonoscopy with snare polypectomy ASA CLASS:  Class II INDICATIONS:  family hisotory of colon cancer (brother and sister)  MEDICATIONS:  MAC sedation, administered by CRNA  DESCRIPTION OF PROCEDURE:   After the risks benefits and alternatives of the procedure were thoroughly explained, informed consent was obtained.  Digital rectal exam was performed and revealed no rectal masses.   The Pentax Colonoscope [id number] endoscope was introduced through the anus and advanced to the cecum, which was identified by both the appendix and ileocecal valve, without limitations.  The quality of the prep was good.. The instrument was then slowly withdrawn as the colon was fully examined.<<PROCEDUREIMAGES>> FINDINGS:  A sessile polyp was found in the distal transverse colon. This was 1.7cm across, was removed piecemeal with cold snare and sent to pathology (jar 1) (see image004 and image006). This was otherwise a normal examination of the colon (see image007, image003, and image002).   Retroflexed views in the rectum revealed no abnormalities. COMPLICATIONS:  None  ENDOSCOPIC IMPRESSION: 1) Sessile polyp in the distal transverse colon 2) Otherwise normal examination  RECOMMENDATIONS: Await final pathology.  Given piecemeal resection, you will likely need repeat examination in 6 months ([facility] with propofol).  ______________________________ Rachael Fee, MD  cc: Crosby Oyster, MD  n. eSIGNED:   Rachael Fee at 11/22/2011 08:21 AM  Betsey Holiday, Barbara, [phone]

## 2011-11-22 NOTE — Transfer of Care (Signed)
Immediate Anesthesia Transfer of Care Note  Patient: Barbara [name]  Procedure(s) Performed:  COLONOSCOPY  Patient Location: PACU  Anesthesia Type: MAC  Level of Consciousness: awake, oriented, patient cooperative and responds to stimulation  Airway & Oxygen Therapy: Patient Spontanous Breathing and Patient connected to face mask oxygen  Post-op Assessment: Report given to PACU RN, Post -op Vital signs reviewed and stable and Patient moving all extremities  Post vital signs: Reviewed and stable  Complications: No apparent anesthesia complications

## 2011-11-23 ENCOUNTER — Ambulatory Visit (INDEPENDENT_AMBULATORY_CARE_PROVIDER_SITE_OTHER): Payer: 59 | Admitting: Medical

## 2011-11-23 ENCOUNTER — Encounter (HOSPITAL_COMMUNITY): Payer: Self-pay | Admitting: Gastroenterology

## 2011-11-23 VITALS — BP 118/70 | HR 62 | Temp 98.2°F | Resp 16 | Wt 221.0 lb

## 2011-11-23 DIAGNOSIS — N898 Other specified noninflammatory disorders of vagina: Secondary | ICD-10-CM

## 2011-11-23 DIAGNOSIS — R300 Dysuria: Secondary | ICD-10-CM | POA: Insufficient documentation

## 2011-11-23 LAB — POCT URINALYSIS DIPSTICK
Bilirubin, UA: NEGATIVE
Ketones, UA: NEGATIVE
Leukocytes, UA: NEGATIVE
pH, UA: 7

## 2011-11-23 MED ORDER — FLUCONAZOLE 150 MG PO TABS: 150.0000 mg | ORAL_TABLET | Freq: Once | ORAL | Status: AC

## 2011-11-23 NOTE — Progress Notes (Signed)
Subjective:   HPI  Barbara [name] is a 60 y.o. female who presents for vaginal discharge, vaginal itching, not sure about redness though.  Using panty liner and has strong odor.  Discharge is brown.  Symptoms x 3 weeks.  Last sexually activity years ago.  No bleeding.  Pap smear August with Dr. Ambrose Mantle, negative.  No finding per gyn.  Not sure about STD testing at that time.  Having some urinary discomfort, having some back pain, but no belly pain.  No other aggravating or relieving factors.    No other c/o.  The following portions of the patient's history were reviewed and updated as appropriate: allergies, current medications, past family history, past medical history, past social history, past surgical history and problem list.  Past Medical History  Diagnosis Date  . Hyperlipidemia   . Obesity   . Diabetes mellitus     TYPE 2  . Postmenopausal   . Vitamin D deficiency disease   . Anemia     Review of Systems Constitutional: -fever, -chills, -sweats, -unexpected -weight change,-fatigue ENT: -runny nose, -ear pain, -sore throat Cardiology:  -chest pain, -palpitations, -edema Respiratory: -cough, -shortness of breath, -wheezing Gastroenterology: -abdominal pain, -nausea, -vomiting, -diarrhea, -constipation Hematology: -bleeding or bruising problems Musculoskeletal: -arthralgias, -myalgias, -joint swelling, -back pain Ophthalmology: -vision changes Urology: -dysuria, +difficulty urinating, +hematuria, +urinary frequency, +urgency Neurology: -headache, -weakness, -tingling, -numbness     Objective:   Physical Exam  Filed Vitals:   11/23/11 1434  BP: 118/70  Pulse: 62  Temp: 98.2 F (36.8 C)  Resp: 16    General appearance: alert, no distress, WD/WN Abdomen: +bs, soft, non tender, non distended, no masses, no hepatomegaly, no splenomegaly Back nontender Gynecology: Normal external genitalia, small amount white vaginal discharge, otherwise no lesions, nontender, no adnexal  tenderness, no mass, swabs taken, chaperoned by nurse  Assessment and Plan :    Encounter Diagnoses  Name Primary?  . Dysuria Yes  . Vaginal discharge    Dysuria-urinalysis normal today.  Vaginal discharge-wet prep in-house appear to show a few yeast but otherwise negative, will send swabs for confirmation wet prep as well as GC and Chlamydia testing. Prescription for Diflucan   Follow-up pending labs.

## 2011-11-24 LAB — WET PREP, GENITAL: Trich, Wet Prep: NONE SEEN

## 2011-11-26 LAB — GC/CHLAMYDIA PROBE AMP, GENITAL: GC Probe Amp, Genital: NEGATIVE

## 2012-05-16 ENCOUNTER — Other Ambulatory Visit: Payer: Self-pay | Admitting: Obstetrics and Gynecology

## 2012-05-16 DIAGNOSIS — Z1231 Encounter for screening mammogram for malignant neoplasm of breast: Secondary | ICD-10-CM

## 2012-06-27 ENCOUNTER — Ambulatory Visit: Payer: 59

## 2012-08-22 ENCOUNTER — Ambulatory Visit
Admission: RE | Admit: 2012-08-22 | Discharge: 2012-08-22 | Disposition: A | Discharge status: Home / Self Care | Payer: 59 | Source: Ambulatory Visit | Attending: Obstetrics and Gynecology | Admitting: Obstetrics and Gynecology

## 2012-08-22 DIAGNOSIS — Z1231 Encounter for screening mammogram for malignant neoplasm of breast: Secondary | ICD-10-CM

## 2013-03-04 ENCOUNTER — Encounter: Payer: Self-pay | Admitting: Gastroenterology

## 2013-03-16 ENCOUNTER — Telehealth: Payer: Self-pay | Admitting: Internal Medicine

## 2013-03-16 NOTE — Telephone Encounter (Signed)
Faxed medical records to the [facility] [place], [place]. @[phone] .[phone]

## 2013-03-26 DIAGNOSIS — Z0289 Encounter for other administrative examinations: Secondary | ICD-10-CM

## 2013-03-30 ENCOUNTER — Encounter: Payer: Self-pay | Admitting: Gastroenterology

## 2013-05-01 ENCOUNTER — Encounter: Payer: Self-pay | Admitting: *Deleted

## 2013-05-01 NOTE — Progress Notes (Signed)
Patient ID: Barbara [name], female   DOB: Apr 16, 1951, 62 y.o.   MRN: [id number] Can be [facility], MAC+ Thanks ----- Message ----- From: Lucia Gaskins, RN Sent: 05/01/2013 9:50 AM To: Rachael Fee, MD Patient coming in for recall colon, last colon 11-22-11 at [facility] with propofol. Please review, does patient need to have colon at [facility] or [facility]? Thanks,Jaquille Kau  Notified patient,scheduled propofol colonoscopy for 05-13-13 9:30 am.

## 2013-05-05 ENCOUNTER — Ambulatory Visit (AMBULATORY_SURGERY_CENTER): Payer: 59

## 2013-05-05 ENCOUNTER — Encounter: Payer: Self-pay | Admitting: Gastroenterology

## 2013-05-05 VITALS — Ht 64.0 in | Wt 229.2 lb

## 2013-05-05 DIAGNOSIS — Z800 Family history of malignant neoplasm of digestive organs: Secondary | ICD-10-CM

## 2013-05-05 DIAGNOSIS — Z86010 Personal history of colonic polyps: Secondary | ICD-10-CM

## 2013-05-05 DIAGNOSIS — Z1211 Encounter for screening for malignant neoplasm of colon: Secondary | ICD-10-CM

## 2013-05-05 MED ORDER — MOVIPREP 100 G PO SOLR: ORAL | Status: DC

## 2013-05-13 ENCOUNTER — Encounter: Payer: Self-pay | Admitting: Gastroenterology

## 2013-05-13 ENCOUNTER — Ambulatory Visit (AMBULATORY_SURGERY_CENTER): Payer: 59 | Admitting: Gastroenterology

## 2013-05-13 VITALS — BP 117/67 | HR 59 | Temp 98.7°F | Resp 21 | Ht 64.0 in | Wt 229.0 lb

## 2013-05-13 DIAGNOSIS — D126 Benign neoplasm of colon, unspecified: Secondary | ICD-10-CM

## 2013-05-13 DIAGNOSIS — Z800 Family history of malignant neoplasm of digestive organs: Secondary | ICD-10-CM

## 2013-05-13 DIAGNOSIS — Z86010 Personal history of colonic polyps: Secondary | ICD-10-CM

## 2013-05-13 DIAGNOSIS — Z1211 Encounter for screening for malignant neoplasm of colon: Secondary | ICD-10-CM

## 2013-05-13 DIAGNOSIS — K644 Residual hemorrhoidal skin tags: Secondary | ICD-10-CM

## 2013-05-13 LAB — GLUCOSE, CAPILLARY: Glucose-Capillary: 139 mg/dL — ABNORMAL HIGH (ref 70–99)

## 2013-05-13 MED ORDER — SODIUM CHLORIDE 0.9 % IV SOLN: 500.0000 mL | INTRAVENOUS | Status: DC

## 2013-05-13 NOTE — Progress Notes (Signed)
Patient did not experience any of the following events: a burn prior to discharge; a fall within the facility; wrong site/side/patient/procedure/implant event; or a hospital transfer or hospital admission upon discharge from the facility. (G8907) Patient did not have preoperative order for IV antibiotic SSI prophylaxis. (G8918)  

## 2013-05-13 NOTE — Patient Instructions (Addendum)
YOU HAD AN ENDOSCOPIC PROCEDURE TODAY AT THE [facility]: Refer to the procedure report that was given to you for any specific questions about what was found during the examination.  If the procedure report does not answer your questions, please call your gastroenterologist to clarify.  If you requested that your care partner not be given the details of your procedure findings, then the procedure report has been included in a sealed envelope for you to review at your convenience later.  YOU SHOULD EXPECT: Some feelings of bloating in the abdomen. Passage of more gas than usual.  Walking can help get rid of the air that was put into your GI tract during the procedure and reduce the bloating. If you had a lower endoscopy (such as a colonoscopy or flexible sigmoidoscopy) you may notice spotting of blood in your stool or on the toilet paper. If you underwent a bowel prep for your procedure, then you may not have a normal bowel movement for a few days.  DIET: Your first meal following the procedure should be a light meal and then it is ok to progress to your normal diet.  A half-sandwich or bowl of soup is an example of a good first meal.  Heavy or fried foods are harder to digest and may make you feel nauseous or bloated.  Likewise meals heavy in dairy and vegetables can cause extra gas to form and this can also increase the bloating.  Drink plenty of fluids but you should avoid alcoholic beverages for 24 hours.  ACTIVITY: Your care partner should take you home directly after the procedure.  You should plan to take it easy, moving slowly for the rest of the day.  You can resume normal activity the day after the procedure however you should NOT DRIVE or use heavy machinery for 24 hours (because of the sedation medicines used during the test).    SYMPTOMS TO REPORT IMMEDIATELY: A gastroenterologist can be reached at any hour.  During normal business hours, 8:30 AM to 5:00 PM Monday through Friday,  call [phone].  After hours and on weekends, please call the GI answering service at [phone] who will take a message and have the physician on call contact you.   Following lower endoscopy (colonoscopy or flexible sigmoidoscopy):  Excessive amounts of blood in the stool  Significant tenderness or worsening of abdominal pains  Swelling of the abdomen that is new, acute  Fever of 100F or higher FOLLOW UP: If any biopsies were taken you will be contacted by phone or by letter within the next 1-3 weeks.  Call your gastroenterologist if you have not heard about the biopsies in 3 weeks.  Our staff will call the home number listed on your records the next business day following your procedure to check on you and address any questions or concerns that you may have at that time regarding the information given to you following your procedure. This is a courtesy call and so if there is no answer at the home number and we have not heard from you through the emergency physician on call, we will assume that you have returned to your regular daily activities without incident.  SIGNATURES/CONFIDENTIALITY: You and/or your care partner have signed paperwork which will be entered into your electronic medical record.  These signatures attest to the fact that that the information above on your After Visit Summary has been reviewed and is understood.  Full responsibility of the confidentiality of this discharge   information lies with you and/or your care-partner.  Polyp information given. 

## 2013-05-13 NOTE — Op Note (Signed)
[facility] [address] [place], [address]   COLONOSCOPY PROCEDURE REPORT  PATIENT: [name], Barbara  MR#: [id number] BIRTHDATE: January 02, 1951 , 61  yrs. old GENDER: Female ENDOSCOPIST: Rachael Fee, MD PROCEDURE DATE:  05/13/2013 PROCEDURE:   Colonoscopy with snare polypectomy and Submucosal injection, any substance ASA CLASS:   Class III INDICATIONS:TVA 1.7cm removed from distal transverse colon 12/12 Christella Hartigan), piecemeal resected.  Was asked to have repeat colonoscopy 6 month interval, FH of colon cancer MEDICATIONS: MAC sedation, administered by CRNA and propofol (Diprivan) 200mg  IV  DESCRIPTION OF PROCEDURE:   After the risks benefits and alternatives of the procedure were thoroughly explained, informed consent was obtained.  A digital rectal exam revealed no abnormalities of the rectum.   The LB [id number]  endoscope was introduced through the anus and advanced to the cecum, which was identified by both the appendix and ileocecal valve. No adverse events experienced.   The quality of the prep was good.  The instrument was then slowly withdrawn as the colon was fully examined.  COLON FINDINGS: One polyp was found, removed and sent to pathology. This was in distal transverse segment, likely residual or recurrent sessile polyp from 2012 removal.  It was 1cm across, removed piecemeal with snare/cautery, then the site was labeled with injection of [place] Ink.  There were small external hemorrhoids. The examination was otherwise normal.  Retroflexed views revealed no abnormalities. The time to cecum=4 minutes 45 seconds. Withdrawal time=13 minutes 30 seconds.  The scope was withdrawn and the procedure completed. COMPLICATIONS: There were no complications.  ENDOSCOPIC IMPRESSION: One polyp was found, removed and sent to pathology.  This was in distal transverse segment, likely residual or recurrent sessile polyp from 2012 removal.  It was 1cm across,  removed piecemeal with snare/cautery, then the site was labeled with injection of [place] Ink.  There were small external hemorrhoids.  The examination was otherwise normal.  RECOMMENDATIONS: You will receive a letter within 1-2 weeks with the results of your biopsy as well as final recommendations.  Please call my office if you have not received a letter after 3 weeks. You will likely need repeat colonoscopy in 6 months given piecemeal resection needed to remove this polyp.   eSigned:  Rachael Fee, MD 05/13/2013 9:46 AM

## 2013-05-13 NOTE — Progress Notes (Signed)
Lidocaine-40mg IV prior to Propofol InductionPropofol given over incremental dosages 

## 2013-05-13 NOTE — Progress Notes (Signed)
Called to room to assist during endoscopic procedure.  Patient ID and intended procedure confirmed with present staff. Received instructions for my participation in the procedure from the performing physician.  

## 2013-05-14 ENCOUNTER — Telehealth: Payer: Self-pay | Admitting: *Deleted

## 2013-05-14 NOTE — Telephone Encounter (Signed)
  Follow up Call-  Call back number 05/13/2013  Post procedure Call Back phone  # [phone]  Permission to leave phone message Yes     Patient questions:  Do you have a fever, pain , or abdominal swelling? no Pain Score  0 *  Have you tolerated food without any problems? yes  Have you been able to return to your normal activities? yes  Do you have any questions about your discharge instructions: Diet   no Medications  no Follow up visit  no  Do you have questions or concerns about your Care? no  Actions: * If pain score is 4 or above: No action needed, pain <4.

## 2013-05-18 ENCOUNTER — Encounter: Payer: Self-pay | Admitting: Gastroenterology

## 2013-05-19 ENCOUNTER — Other Ambulatory Visit: Payer: 59 | Admitting: Gastroenterology

## 2013-07-30 ENCOUNTER — Other Ambulatory Visit: Payer: Self-pay

## 2013-07-30 DIAGNOSIS — Z1231 Encounter for screening mammogram for malignant neoplasm of breast: Secondary | ICD-10-CM

## 2013-08-20 ENCOUNTER — Encounter: Payer: Self-pay | Admitting: Gastroenterology

## 2013-08-27 ENCOUNTER — Ambulatory Visit
Admission: RE | Admit: 2013-08-27 | Discharge: 2013-08-27 | Disposition: A | Discharge status: Home / Self Care | Payer: 59 | Source: Ambulatory Visit

## 2013-08-27 DIAGNOSIS — Z1231 Encounter for screening mammogram for malignant neoplasm of breast: Secondary | ICD-10-CM

## 2014-01-13 ENCOUNTER — Encounter: Payer: Self-pay | Admitting: Gastroenterology

## 2014-03-05 ENCOUNTER — Ambulatory Visit (AMBULATORY_SURGERY_CENTER): Payer: Self-pay | Admitting: *Deleted

## 2014-03-05 VITALS — Ht 64.0 in | Wt 223.0 lb

## 2014-03-05 DIAGNOSIS — Z86010 Personal history of colonic polyps: Secondary | ICD-10-CM

## 2014-03-05 MED ORDER — MOVIPREP 100 G PO SOLR: 1.0000 | Freq: Once | ORAL | Status: DC

## 2014-03-05 NOTE — Progress Notes (Signed)
No egg or soy allergy. No anesthesia problems.  

## 2014-03-08 ENCOUNTER — Encounter: Payer: Self-pay | Admitting: Gastroenterology

## 2014-03-12 ENCOUNTER — Ambulatory Visit (AMBULATORY_SURGERY_CENTER): Payer: 59 | Admitting: Gastroenterology

## 2014-03-12 ENCOUNTER — Encounter: Payer: Self-pay | Admitting: Gastroenterology

## 2014-03-12 VITALS — BP 123/68 | HR 56 | Temp 98.4°F | Resp 15 | Ht 64.0 in | Wt 223.0 lb

## 2014-03-12 DIAGNOSIS — Z86010 Personal history of colonic polyps: Secondary | ICD-10-CM

## 2014-03-12 DIAGNOSIS — Z800 Family history of malignant neoplasm of digestive organs: Secondary | ICD-10-CM

## 2014-03-12 LAB — GLUCOSE, CAPILLARY
GLUCOSE-CAPILLARY: 118 mg/dL — AB (ref 70–99)
GLUCOSE-CAPILLARY: 97 mg/dL (ref 70–99)

## 2014-03-12 MED ORDER — SODIUM CHLORIDE 0.9 % IV SOLN: 500.0000 mL | INTRAVENOUS | Status: DC

## 2014-03-12 NOTE — Progress Notes (Signed)
Procedure ends, to recovery, report given and VSS. 

## 2014-03-12 NOTE — Op Note (Signed)
[place]  [address]. [place], [address]   COLONOSCOPY PROCEDURE REPORT  PATIENT: [name], Barbara  MR#: [id number] BIRTHDATE: 13-Jun-1951 , 62  yrs. old GENDER: Female ENDOSCOPIST: Milus Banister, MD PROCEDURE DATE:  03/12/2014 PROCEDURE:   Colonoscopy, diagnostic First Screening Colonoscopy - Avg.  risk and is 50 yrs.  old or older - No.  Prior Negative Screening - Now for repeat screening. N/A  History of Adenoma - Now for follow-up colonoscopy & has been > or = to 3 yrs.  No.  It has been less than 3 yrs since last colonoscopy.  Medical reason.  Polyps Removed Today? No.  Recommend repeat exam, <10 yrs? Yes.  High risk (family or personal hx). ASA CLASS:   Class II INDICATIONS:FH of colon cancer, also 2012 Colonoscopy with 1.7cm TVA, removed piecemeal; repeat colonoscopy 04/2013 colonoscopy and SSA removed 1cm, piecemeal, site was tatoo'd. MEDICATIONS: MAC sedation, administered by CRNA and Propofol (Diprivan) 230 mg IV  DESCRIPTION OF PROCEDURE:   After the risks benefits and alternatives of the procedure were thoroughly explained, informed consent was obtained.  A digital rectal exam revealed no abnormalities of the rectum.   The LB [id number]  endoscope was introduced through the anus and advanced to the cecum, which was identified by both the appendix and ileocecal valve. No adverse events experienced.   The quality of the prep was excellent.  The instrument was then slowly withdrawn as the colon was fully examined.  COLON FINDINGS: The site of 2014 polypectomy in distal transverse colon was located with aid of previous [place] Ink injection, there was no residual or recurrent polyp.  The examination was otherwise normal.  Retroflexed views revealed no abnormalities. The time to cecum=4 minutes 27 seconds.  Withdrawal time=7 minutes 32 seconds. The scope was withdrawn and the procedure completed. COMPLICATIONS: There were no  complications.  ENDOSCOPIC IMPRESSION: The site of 2014 polypectomy in distal transverse colon was located with aid of previous [place] Ink injection, there was no residual or recurrent polyp.  The examination was otherwise normal.  RECOMMENDATIONS: Given your personal history of adenomatous (pre-cancerous) polyps, you will need a repeat colonoscopy in 3 years.   eSigned:  Milus Banister, MD 03/12/2014 11:06 AM

## 2014-03-12 NOTE — Patient Instructions (Signed)
YOU HAD AN ENDOSCOPIC PROCEDURE TODAY AT [facility]: Refer to the procedure report that was given to you for any specific questions about what was found during the examination.  If the procedure report does not answer your questions, please call your gastroenterologist to clarify.  If you requested that your care partner not be given the details of your procedure findings, then the procedure report has been included in a sealed envelope for you to review at your convenience later.  YOU SHOULD EXPECT: Some feelings of bloating in the abdomen. Passage of more gas than usual.  Walking can help get rid of the air that was put into your GI tract during the procedure and reduce the bloating. If you had a lower endoscopy (such as a colonoscopy or flexible sigmoidoscopy) you may notice spotting of blood in your stool or on the toilet paper. If you underwent a bowel prep for your procedure, then you may not have a normal bowel movement for a few days.  DIET: Your first meal following the procedure should be a light meal and then it is ok to progress to your normal diet.  A half-sandwich or bowl of soup is an example of a good first meal.  Heavy or fried foods are harder to digest and may make you feel nauseous or bloated.  Likewise meals heavy in dairy and vegetables can cause extra gas to form and this can also increase the bloating.  Drink plenty of fluids but you should avoid alcoholic beverages for 24 hours.  ACTIVITY: Your care partner should take you home directly after the procedure.  You should plan to take it easy, moving slowly for the rest of the day.  You can resume normal activity the day after the procedure however you should NOT DRIVE or use heavy machinery for 24 hours (because of the sedation medicines used during the test).    SYMPTOMS TO REPORT IMMEDIATELY: A gastroenterologist can be reached at any hour.  During normal business hours, 8:30 AM to 5:00 PM Monday through Friday,  call [phone].  After hours and on weekends, please call the GI answering service at [phone] who will take a message and have the physician on call contact you.   Following lower endoscopy (colonoscopy or flexible sigmoidoscopy):  Excessive amounts of blood in the stool  Significant tenderness or worsening of abdominal pains  Swelling of the abdomen that is new, acute  Fever of 100F or higher  FOLLOW UP: Our staff will call the home number listed on your records the next business day following your procedure to check on you and address any questions or concerns that you may have at that time regarding the information given to you following your procedure. This is a courtesy call and so if there is no answer at the home number and we have not heard from you through the emergency physician on call, we will assume that you have returned to your regular daily activities without incident.  SIGNATURES/CONFIDENTIALITY: You and/or your care partner have signed paperwork which will be entered into your electronic medical record.  These signatures attest to the fact that that the information above on your After Visit Summary has been reviewed and is understood.  Full responsibility of the confidentiality of this discharge information lies with you and/or your care-partner.  Next colonoscopy in 3 years   Continue your normal medications

## 2014-03-15 ENCOUNTER — Telehealth: Payer: Self-pay

## 2014-03-15 NOTE — Telephone Encounter (Signed)
Left a message at [phone] for the pt to call if any questions or concerns. Maw

## 2014-07-30 ENCOUNTER — Other Ambulatory Visit: Payer: Self-pay

## 2014-07-30 DIAGNOSIS — Z1231 Encounter for screening mammogram for malignant neoplasm of breast: Secondary | ICD-10-CM

## 2014-08-30 ENCOUNTER — Ambulatory Visit
Admission: RE | Admit: 2014-08-30 | Discharge: 2014-08-30 | Disposition: A | Discharge status: Home / Self Care | Payer: 59 | Source: Ambulatory Visit

## 2014-08-30 DIAGNOSIS — Z1231 Encounter for screening mammogram for malignant neoplasm of breast: Secondary | ICD-10-CM | POA: Diagnosis not present

## 2014-09-20 DIAGNOSIS — Z87440 Personal history of urinary (tract) infections: Secondary | ICD-10-CM | POA: Diagnosis not present

## 2014-09-20 DIAGNOSIS — D649 Anemia, unspecified: Secondary | ICD-10-CM | POA: Diagnosis not present

## 2014-09-20 DIAGNOSIS — R002 Palpitations: Secondary | ICD-10-CM | POA: Diagnosis not present

## 2014-09-20 DIAGNOSIS — Z23 Encounter for immunization: Secondary | ICD-10-CM | POA: Diagnosis not present

## 2014-09-22 ENCOUNTER — Encounter (INDEPENDENT_AMBULATORY_CARE_PROVIDER_SITE_OTHER): Payer: 59

## 2014-09-22 ENCOUNTER — Encounter: Payer: Self-pay | Admitting: *Deleted

## 2014-09-22 ENCOUNTER — Other Ambulatory Visit: Payer: Self-pay | Admitting: *Deleted

## 2014-09-22 DIAGNOSIS — R002 Palpitations: Secondary | ICD-10-CM

## 2014-09-22 NOTE — Progress Notes (Signed)
Patient ID: [place] Pen, female   DOB: [birth year], 63 y.o.   MRN: [id number] [facility] 24 hour holter monitor applied to patient.

## 2014-10-06 DIAGNOSIS — E1165 Type 2 diabetes mellitus with hyperglycemia: Secondary | ICD-10-CM | POA: Diagnosis not present

## 2014-10-06 DIAGNOSIS — Z794 Long term (current) use of insulin: Secondary | ICD-10-CM | POA: Diagnosis not present

## 2015-02-02 DIAGNOSIS — E1165 Type 2 diabetes mellitus with hyperglycemia: Secondary | ICD-10-CM | POA: Diagnosis not present

## 2015-06-07 ENCOUNTER — Other Ambulatory Visit: Payer: Self-pay | Admitting: Internal Medicine

## 2015-06-07 DIAGNOSIS — M79651 Pain in right thigh: Secondary | ICD-10-CM | POA: Diagnosis not present

## 2015-06-07 DIAGNOSIS — R0989 Other specified symptoms and signs involving the circulatory and respiratory systems: Secondary | ICD-10-CM | POA: Diagnosis not present

## 2015-06-10 ENCOUNTER — Other Ambulatory Visit: Payer: Self-pay | Admitting: Internal Medicine

## 2015-06-10 ENCOUNTER — Ambulatory Visit
Admission: RE | Admit: 2015-06-10 | Discharge: 2015-06-10 | Disposition: A | Discharge status: Home / Self Care | Payer: 59 | Source: Ambulatory Visit | Attending: Internal Medicine | Admitting: Internal Medicine

## 2015-06-10 DIAGNOSIS — R0989 Other specified symptoms and signs involving the circulatory and respiratory systems: Secondary | ICD-10-CM

## 2015-06-10 DIAGNOSIS — M79604 Pain in right leg: Secondary | ICD-10-CM | POA: Diagnosis not present

## 2015-07-19 DIAGNOSIS — Z6838 Body mass index (BMI) 38.0-38.9, adult: Secondary | ICD-10-CM | POA: Diagnosis not present

## 2015-07-19 DIAGNOSIS — N898 Other specified noninflammatory disorders of vagina: Secondary | ICD-10-CM | POA: Diagnosis not present

## 2015-07-19 DIAGNOSIS — R351 Nocturia: Secondary | ICD-10-CM | POA: Diagnosis not present

## 2015-07-19 DIAGNOSIS — Z124 Encounter for screening for malignant neoplasm of cervix: Secondary | ICD-10-CM | POA: Diagnosis not present

## 2015-07-19 DIAGNOSIS — Z01419 Encounter for gynecological examination (general) (routine) without abnormal findings: Secondary | ICD-10-CM | POA: Diagnosis not present

## 2015-07-19 DIAGNOSIS — N952 Postmenopausal atrophic vaginitis: Secondary | ICD-10-CM | POA: Diagnosis not present

## 2015-07-26 DIAGNOSIS — Z6838 Body mass index (BMI) 38.0-38.9, adult: Secondary | ICD-10-CM | POA: Diagnosis not present

## 2015-07-26 DIAGNOSIS — Z23 Encounter for immunization: Secondary | ICD-10-CM | POA: Diagnosis not present

## 2015-07-26 DIAGNOSIS — E1165 Type 2 diabetes mellitus with hyperglycemia: Secondary | ICD-10-CM | POA: Diagnosis not present

## 2015-07-26 DIAGNOSIS — M20001 Unspecified deformity of right finger(s): Secondary | ICD-10-CM | POA: Diagnosis not present

## 2015-07-26 DIAGNOSIS — Z794 Long term (current) use of insulin: Secondary | ICD-10-CM | POA: Diagnosis not present

## 2015-07-26 DIAGNOSIS — Z1389 Encounter for screening for other disorder: Secondary | ICD-10-CM | POA: Diagnosis not present

## 2015-07-26 DIAGNOSIS — Z0000 Encounter for general adult medical examination without abnormal findings: Secondary | ICD-10-CM | POA: Diagnosis not present

## 2015-07-28 DIAGNOSIS — L03011 Cellulitis of right finger: Secondary | ICD-10-CM | POA: Diagnosis not present

## 2015-08-04 DIAGNOSIS — E1165 Type 2 diabetes mellitus with hyperglycemia: Secondary | ICD-10-CM | POA: Diagnosis not present

## 2015-08-04 DIAGNOSIS — Z794 Long term (current) use of insulin: Secondary | ICD-10-CM | POA: Diagnosis not present

## 2015-09-06 DIAGNOSIS — Z1231 Encounter for screening mammogram for malignant neoplasm of breast: Secondary | ICD-10-CM | POA: Diagnosis not present

## 2015-09-06 DIAGNOSIS — Z13820 Encounter for screening for osteoporosis: Secondary | ICD-10-CM | POA: Diagnosis not present

## 2015-09-26 ENCOUNTER — Other Ambulatory Visit: Payer: Self-pay | Admitting: Orthopedic Surgery

## 2015-09-26 DIAGNOSIS — L03011 Cellulitis of right finger: Secondary | ICD-10-CM | POA: Diagnosis not present

## 2015-09-26 DIAGNOSIS — M25541 Pain in joints of right hand: Secondary | ICD-10-CM | POA: Diagnosis not present

## 2015-09-26 DIAGNOSIS — L720 Epidermal cyst: Secondary | ICD-10-CM | POA: Diagnosis not present

## 2015-09-26 DIAGNOSIS — B078 Other viral warts: Secondary | ICD-10-CM | POA: Diagnosis not present

## 2015-10-30 DIAGNOSIS — Z23 Encounter for immunization: Secondary | ICD-10-CM | POA: Diagnosis not present

## 2015-11-22 DIAGNOSIS — E1165 Type 2 diabetes mellitus with hyperglycemia: Secondary | ICD-10-CM | POA: Diagnosis not present

## 2015-11-22 DIAGNOSIS — Z7984 Long term (current) use of oral hypoglycemic drugs: Secondary | ICD-10-CM | POA: Diagnosis not present

## 2015-11-22 DIAGNOSIS — D509 Iron deficiency anemia, unspecified: Secondary | ICD-10-CM | POA: Diagnosis not present

## 2015-12-23 DIAGNOSIS — L501 Idiopathic urticaria: Secondary | ICD-10-CM | POA: Diagnosis not present

## 2015-12-23 DIAGNOSIS — L509 Urticaria, unspecified: Secondary | ICD-10-CM | POA: Diagnosis not present

## 2015-12-23 DIAGNOSIS — Z8349 Family history of other endocrine, nutritional and metabolic diseases: Secondary | ICD-10-CM | POA: Diagnosis not present

## 2016-03-21 DIAGNOSIS — E1165 Type 2 diabetes mellitus with hyperglycemia: Secondary | ICD-10-CM | POA: Diagnosis not present

## 2016-03-21 DIAGNOSIS — Z7984 Long term (current) use of oral hypoglycemic drugs: Secondary | ICD-10-CM | POA: Diagnosis not present

## 2016-03-21 DIAGNOSIS — L501 Idiopathic urticaria: Secondary | ICD-10-CM | POA: Diagnosis not present

## 2016-05-11 DIAGNOSIS — R197 Diarrhea, unspecified: Secondary | ICD-10-CM | POA: Diagnosis not present

## 2016-05-11 DIAGNOSIS — R682 Dry mouth, unspecified: Secondary | ICD-10-CM | POA: Diagnosis not present

## 2016-05-11 DIAGNOSIS — E119 Type 2 diabetes mellitus without complications: Secondary | ICD-10-CM | POA: Diagnosis not present

## 2016-05-11 DIAGNOSIS — M2550 Pain in unspecified joint: Secondary | ICD-10-CM | POA: Diagnosis not present

## 2016-05-11 DIAGNOSIS — Z7984 Long term (current) use of oral hypoglycemic drugs: Secondary | ICD-10-CM | POA: Diagnosis not present

## 2016-05-11 DIAGNOSIS — H04123 Dry eye syndrome of bilateral lacrimal glands: Secondary | ICD-10-CM | POA: Diagnosis not present

## 2016-05-11 DIAGNOSIS — L508 Other urticaria: Secondary | ICD-10-CM | POA: Diagnosis not present

## 2016-05-17 DIAGNOSIS — R899 Unspecified abnormal finding in specimens from other organs, systems and tissues: Secondary | ICD-10-CM | POA: Diagnosis not present

## 2016-07-02 DIAGNOSIS — R5383 Other fatigue: Secondary | ICD-10-CM | POA: Diagnosis not present

## 2016-07-02 DIAGNOSIS — R05 Cough: Secondary | ICD-10-CM | POA: Diagnosis not present

## 2016-07-02 DIAGNOSIS — L509 Urticaria, unspecified: Secondary | ICD-10-CM | POA: Diagnosis not present

## 2016-07-02 DIAGNOSIS — R768 Other specified abnormal immunological findings in serum: Secondary | ICD-10-CM | POA: Diagnosis not present

## 2016-07-23 DIAGNOSIS — R21 Rash and other nonspecific skin eruption: Secondary | ICD-10-CM | POA: Diagnosis not present

## 2016-08-24 DIAGNOSIS — J209 Acute bronchitis, unspecified: Secondary | ICD-10-CM | POA: Diagnosis not present

## 2016-09-15 DIAGNOSIS — Z23 Encounter for immunization: Secondary | ICD-10-CM | POA: Diagnosis not present

## 2016-09-24 DIAGNOSIS — D509 Iron deficiency anemia, unspecified: Secondary | ICD-10-CM | POA: Diagnosis not present

## 2016-09-24 DIAGNOSIS — Z1231 Encounter for screening mammogram for malignant neoplasm of breast: Secondary | ICD-10-CM | POA: Diagnosis not present

## 2016-09-24 DIAGNOSIS — E1165 Type 2 diabetes mellitus with hyperglycemia: Secondary | ICD-10-CM | POA: Diagnosis not present

## 2016-09-24 DIAGNOSIS — Z794 Long term (current) use of insulin: Secondary | ICD-10-CM | POA: Diagnosis not present

## 2016-09-24 DIAGNOSIS — L508 Other urticaria: Secondary | ICD-10-CM | POA: Diagnosis not present

## 2016-11-15 DIAGNOSIS — B029 Zoster without complications: Secondary | ICD-10-CM | POA: Diagnosis not present

## 2016-11-15 DIAGNOSIS — E1165 Type 2 diabetes mellitus with hyperglycemia: Secondary | ICD-10-CM | POA: Diagnosis not present

## 2016-11-15 DIAGNOSIS — B0223 Postherpetic polyneuropathy: Secondary | ICD-10-CM | POA: Diagnosis not present

## 2016-11-15 DIAGNOSIS — Z794 Long term (current) use of insulin: Secondary | ICD-10-CM | POA: Diagnosis not present

## 2016-11-30 ENCOUNTER — Ambulatory Visit (INDEPENDENT_AMBULATORY_CARE_PROVIDER_SITE_OTHER): Payer: 59 | Admitting: Endocrinology

## 2016-11-30 DIAGNOSIS — Z0289 Encounter for other administrative examinations: Secondary | ICD-10-CM

## 2016-12-18 DIAGNOSIS — E119 Type 2 diabetes mellitus without complications: Secondary | ICD-10-CM | POA: Diagnosis not present

## 2016-12-18 DIAGNOSIS — L508 Other urticaria: Secondary | ICD-10-CM | POA: Diagnosis not present

## 2016-12-18 DIAGNOSIS — Z0000 Encounter for general adult medical examination without abnormal findings: Secondary | ICD-10-CM | POA: Diagnosis not present

## 2016-12-18 DIAGNOSIS — Z7189 Other specified counseling: Secondary | ICD-10-CM | POA: Diagnosis not present

## 2016-12-18 DIAGNOSIS — R682 Dry mouth, unspecified: Secondary | ICD-10-CM | POA: Diagnosis not present

## 2016-12-18 DIAGNOSIS — Z1389 Encounter for screening for other disorder: Secondary | ICD-10-CM | POA: Diagnosis not present

## 2016-12-26 DIAGNOSIS — Z1239 Encounter for other screening for malignant neoplasm of breast: Secondary | ICD-10-CM | POA: Diagnosis not present

## 2016-12-26 DIAGNOSIS — Z794 Long term (current) use of insulin: Secondary | ICD-10-CM | POA: Diagnosis not present

## 2016-12-26 DIAGNOSIS — E1140 Type 2 diabetes mellitus with diabetic neuropathy, unspecified: Secondary | ICD-10-CM | POA: Diagnosis not present

## 2016-12-26 DIAGNOSIS — E119 Type 2 diabetes mellitus without complications: Secondary | ICD-10-CM | POA: Diagnosis not present

## 2016-12-26 DIAGNOSIS — R682 Dry mouth, unspecified: Secondary | ICD-10-CM | POA: Diagnosis not present

## 2016-12-26 DIAGNOSIS — Z7984 Long term (current) use of oral hypoglycemic drugs: Secondary | ICD-10-CM | POA: Diagnosis not present

## 2016-12-26 DIAGNOSIS — D509 Iron deficiency anemia, unspecified: Secondary | ICD-10-CM | POA: Diagnosis not present

## 2016-12-26 DIAGNOSIS — L508 Other urticaria: Secondary | ICD-10-CM | POA: Diagnosis not present

## 2016-12-26 DIAGNOSIS — Z1211 Encounter for screening for malignant neoplasm of colon: Secondary | ICD-10-CM | POA: Diagnosis not present

## 2016-12-31 ENCOUNTER — Encounter: Payer: Self-pay | Admitting: Gastroenterology

## 2017-01-23 DIAGNOSIS — Z794 Long term (current) use of insulin: Secondary | ICD-10-CM | POA: Diagnosis not present

## 2017-01-23 DIAGNOSIS — E119 Type 2 diabetes mellitus without complications: Secondary | ICD-10-CM | POA: Diagnosis not present

## 2017-01-25 DIAGNOSIS — E119 Type 2 diabetes mellitus without complications: Secondary | ICD-10-CM | POA: Diagnosis not present

## 2017-01-25 DIAGNOSIS — Z794 Long term (current) use of insulin: Secondary | ICD-10-CM | POA: Diagnosis not present

## 2017-02-01 DIAGNOSIS — Z1211 Encounter for screening for malignant neoplasm of colon: Secondary | ICD-10-CM | POA: Diagnosis not present

## 2017-02-25 DIAGNOSIS — E119 Type 2 diabetes mellitus without complications: Secondary | ICD-10-CM | POA: Diagnosis not present

## 2017-02-25 DIAGNOSIS — H2513 Age-related nuclear cataract, bilateral: Secondary | ICD-10-CM | POA: Diagnosis not present

## 2017-03-01 DIAGNOSIS — E119 Type 2 diabetes mellitus without complications: Secondary | ICD-10-CM | POA: Diagnosis not present

## 2017-03-01 DIAGNOSIS — Z7984 Long term (current) use of oral hypoglycemic drugs: Secondary | ICD-10-CM | POA: Diagnosis not present

## 2017-03-01 DIAGNOSIS — E1165 Type 2 diabetes mellitus with hyperglycemia: Secondary | ICD-10-CM | POA: Diagnosis not present

## 2017-03-01 DIAGNOSIS — D509 Iron deficiency anemia, unspecified: Secondary | ICD-10-CM | POA: Diagnosis not present

## 2017-03-01 DIAGNOSIS — E1140 Type 2 diabetes mellitus with diabetic neuropathy, unspecified: Secondary | ICD-10-CM | POA: Diagnosis not present

## 2017-03-11 DIAGNOSIS — J0110 Acute frontal sinusitis, unspecified: Secondary | ICD-10-CM | POA: Diagnosis not present

## 2017-03-11 DIAGNOSIS — Z7984 Long term (current) use of oral hypoglycemic drugs: Secondary | ICD-10-CM | POA: Diagnosis not present

## 2017-03-11 DIAGNOSIS — E1165 Type 2 diabetes mellitus with hyperglycemia: Secondary | ICD-10-CM | POA: Diagnosis not present

## 2017-03-11 DIAGNOSIS — E119 Type 2 diabetes mellitus without complications: Secondary | ICD-10-CM | POA: Diagnosis not present

## 2017-03-11 DIAGNOSIS — Z794 Long term (current) use of insulin: Secondary | ICD-10-CM | POA: Diagnosis not present

## 2017-03-11 DIAGNOSIS — J301 Allergic rhinitis due to pollen: Secondary | ICD-10-CM | POA: Diagnosis not present

## 2017-05-23 DIAGNOSIS — L508 Other urticaria: Secondary | ICD-10-CM | POA: Diagnosis not present

## 2017-05-23 DIAGNOSIS — R682 Dry mouth, unspecified: Secondary | ICD-10-CM | POA: Diagnosis not present

## 2017-05-23 DIAGNOSIS — E119 Type 2 diabetes mellitus without complications: Secondary | ICD-10-CM | POA: Diagnosis not present

## 2017-07-22 DIAGNOSIS — R351 Nocturia: Secondary | ICD-10-CM | POA: Diagnosis not present

## 2017-07-22 DIAGNOSIS — R3915 Urgency of urination: Secondary | ICD-10-CM | POA: Diagnosis not present

## 2017-07-22 DIAGNOSIS — N952 Postmenopausal atrophic vaginitis: Secondary | ICD-10-CM | POA: Diagnosis not present

## 2017-07-22 DIAGNOSIS — Z6838 Body mass index (BMI) 38.0-38.9, adult: Secondary | ICD-10-CM | POA: Diagnosis not present

## 2017-07-22 DIAGNOSIS — Z01419 Encounter for gynecological examination (general) (routine) without abnormal findings: Secondary | ICD-10-CM | POA: Diagnosis not present

## 2017-07-23 ENCOUNTER — Encounter: Payer: Self-pay | Admitting: Skilled Nursing Facility1

## 2017-07-23 ENCOUNTER — Encounter: Payer: Medicare Other | Attending: Internal Medicine | Admitting: Skilled Nursing Facility1

## 2017-07-23 DIAGNOSIS — E119 Type 2 diabetes mellitus without complications: Secondary | ICD-10-CM | POA: Insufficient documentation

## 2017-07-23 NOTE — Progress Notes (Signed)
Diabetes Self-Management Education  Visit Type: First/Initial 07/23/2017  [name], identified by name and date of birth, is a 66 y.o. female with a diagnosis of Diabetes: Type 2.   ASSESSMENT  Height 5\' 4"  (1.626 m), weight 217 lb (98.4 kg). Body mass index is 37.25 kg/m.  Pt states she does not like her Levemir because she does not feel like it regulated her blood sugar.  Log your blood sugars every day testing at different times throughout the week. For one week keep a log of your blood sugars and what you have eaten.      Diabetes Self-Management Education - 07/23/17 1100      Visit Information   Visit Type First/Initial     Initial Visit   Diabetes Type Type 2   Are you currently following a meal plan? No   Are you taking your medications as prescribed? Yes     Health Coping   How would you rate your overall health? Good     Psychosocial Assessment   Patient Belief/Attitude about Diabetes Motivated to manage diabetes     Pre-Education Assessment   Patient understands the diabetes disease and treatment process. Needs Instruction   Patient understands incorporating nutritional management into lifestyle. Needs Instruction   Patient undertands incorporating physical activity into lifestyle. Needs Instruction   Patient understands using medications safely. Needs Instruction   Patient understands monitoring blood glucose, interpreting and using results Needs Instruction   Patient understands prevention, detection, and treatment of acute complications. Needs Instruction   Patient understands prevention, detection, and treatment of chronic complications. Needs Instruction   Patient understands how to develop strategies to address psychosocial issues. Needs Instruction   Patient understands how to develop strategies to promote health/change behavior. Needs Instruction     Complications   Last HgB A1C per patient/outside source 8 %   How often do you check your blood  sugar? 1-2 times/day   Fasting Blood glucose range (mg/dL) 130-179   Number of hypoglycemic episodes per month 0   Have you had a dilated eye exam in the past 12 months? Yes   Have you had a dental exam in the past 12 months? Yes   Are you checking your feet? Yes   How many days per week are you checking your feet? 7     Dietary Intake   Breakfast apricot and bacon and egg sandwich------yogurt with fruit and trail mix   Snack (morning) fruit   Lunch feild peas, potato salad, ribs   Snack (afternoon) nuts or chips   Dinner pork chops, feild peas, potato salad   Beverage(s) water,wine, soda, wine cooler     Exercise   Exercise Type Light (walking / raking leaves)   How many days per week to you exercise? 30   How many minutes per day do you exercise? 3   Total minutes per week of exercise 90     Patient Education   Previous Diabetes Education No   Disease state  Definition of diabetes, type 1 and 2, and the diagnosis of diabetes   Nutrition management  Role of diet in the treatment of diabetes and the relationship between the three main macronutrients and blood glucose level;Food label reading, portion sizes and measuring food.;Carbohydrate counting;Reviewed blood glucose goals for pre and post meals and how to evaluate the patients' food intake on their blood glucose level.;Meal timing in regards to the patients' current diabetes medication.   Physical activity and exercise  Role of  exercise on diabetes management, blood pressure control and cardiac health.;Identified with patient nutritional and/or medication changes necessary with exercise.   Monitoring Purpose and frequency of SMBG.;Yearly dilated eye exam;Daily foot exams;Identified appropriate SMBG and/or A1C goals.   Acute complications Taught treatment of hypoglycemia - the 15 rule.;Discussed and identified patients' treatment of hyperglycemia.   Chronic complications Assessed and discussed foot care and prevention of foot  problems;Dental care;Retinopathy and reason for yearly dilated eye exams   Psychosocial adjustment Role of stress on diabetes     Individualized Goals (developed by patient)   Nutrition Follow meal plan discussed;General guidelines for healthy choices and portions discussed   Physical Activity Exercise 3-5 times per week;30 minutes per day   Monitoring  test blood glucose pre and post meals as discussed     Post-Education Assessment   Patient understands the diabetes disease and treatment process. Demonstrates understanding / competency   Patient understands incorporating nutritional management into lifestyle. Demonstrates understanding / competency   Patient undertands incorporating physical activity into lifestyle. Demonstrates understanding / competency   Patient understands using medications safely. Demonstrates understanding / competency   Patient understands monitoring blood glucose, interpreting and using results Demonstrates understanding / competency   Patient understands prevention, detection, and treatment of acute complications. Demonstrates understanding / competency   Patient understands prevention, detection, and treatment of chronic complications. Demonstrates understanding / competency   Patient understands how to develop strategies to address psychosocial issues. Demonstrates understanding / competency   Patient understands how to develop strategies to promote health/change behavior. Demonstrates understanding / competency     Outcomes   Expected Outcomes Demonstrated interest in learning. Expect positive outcomes   Future DMSE PRN   Program Status Completed      Individualized Plan for Diabetes Self-Management Training:   Learning Objective:  Patient will have a greater understanding of diabetes self-management. Patient education plan is to attend individual and/or group sessions per assessed needs and concerns.   Plan:   There are no Patient Instructions on file  for this visit.  Expected Outcomes:  Demonstrated interest in learning. Expect positive outcomes  Education material provided: Living Well with Diabetes, Meal plan card and My Plate  If problems or questions, patient to contact team via:  Phone  Future DSME appointment: PRN

## 2017-07-31 DIAGNOSIS — E119 Type 2 diabetes mellitus without complications: Secondary | ICD-10-CM | POA: Diagnosis not present

## 2017-07-31 DIAGNOSIS — R05 Cough: Secondary | ICD-10-CM | POA: Diagnosis not present

## 2017-07-31 DIAGNOSIS — L508 Other urticaria: Secondary | ICD-10-CM | POA: Diagnosis not present

## 2017-08-02 DIAGNOSIS — J301 Allergic rhinitis due to pollen: Secondary | ICD-10-CM | POA: Diagnosis not present

## 2017-08-02 DIAGNOSIS — Z794 Long term (current) use of insulin: Secondary | ICD-10-CM | POA: Diagnosis not present

## 2017-08-02 DIAGNOSIS — E1165 Type 2 diabetes mellitus with hyperglycemia: Secondary | ICD-10-CM | POA: Diagnosis not present

## 2017-08-02 DIAGNOSIS — E119 Type 2 diabetes mellitus without complications: Secondary | ICD-10-CM | POA: Diagnosis not present

## 2017-08-02 DIAGNOSIS — J209 Acute bronchitis, unspecified: Secondary | ICD-10-CM | POA: Diagnosis not present

## 2017-09-03 DIAGNOSIS — E119 Type 2 diabetes mellitus without complications: Secondary | ICD-10-CM | POA: Diagnosis not present

## 2017-09-03 DIAGNOSIS — Z794 Long term (current) use of insulin: Secondary | ICD-10-CM | POA: Diagnosis not present

## 2017-09-03 DIAGNOSIS — J0111 Acute recurrent frontal sinusitis: Secondary | ICD-10-CM | POA: Diagnosis not present

## 2017-09-03 DIAGNOSIS — J301 Allergic rhinitis due to pollen: Secondary | ICD-10-CM | POA: Diagnosis not present

## 2017-09-06 DIAGNOSIS — Z794 Long term (current) use of insulin: Secondary | ICD-10-CM | POA: Diagnosis not present

## 2017-09-06 DIAGNOSIS — R899 Unspecified abnormal finding in specimens from other organs, systems and tissues: Secondary | ICD-10-CM | POA: Diagnosis not present

## 2017-09-06 DIAGNOSIS — E119 Type 2 diabetes mellitus without complications: Secondary | ICD-10-CM | POA: Diagnosis not present

## 2017-09-26 ENCOUNTER — Ambulatory Visit: Payer: 59 | Admitting: Skilled Nursing Facility1

## 2017-09-27 DIAGNOSIS — Z1231 Encounter for screening mammogram for malignant neoplasm of breast: Secondary | ICD-10-CM | POA: Diagnosis not present

## 2017-11-06 DIAGNOSIS — Z23 Encounter for immunization: Secondary | ICD-10-CM | POA: Diagnosis not present

## 2017-12-05 DIAGNOSIS — E785 Hyperlipidemia, unspecified: Secondary | ICD-10-CM | POA: Diagnosis not present

## 2017-12-05 DIAGNOSIS — J301 Allergic rhinitis due to pollen: Secondary | ICD-10-CM | POA: Diagnosis not present

## 2017-12-05 DIAGNOSIS — E1165 Type 2 diabetes mellitus with hyperglycemia: Secondary | ICD-10-CM | POA: Diagnosis not present

## 2017-12-05 DIAGNOSIS — Z7984 Long term (current) use of oral hypoglycemic drugs: Secondary | ICD-10-CM | POA: Diagnosis not present

## 2017-12-05 DIAGNOSIS — Z794 Long term (current) use of insulin: Secondary | ICD-10-CM | POA: Diagnosis not present

## 2018-01-01 DIAGNOSIS — E669 Obesity, unspecified: Secondary | ICD-10-CM | POA: Diagnosis not present

## 2018-01-01 DIAGNOSIS — Z794 Long term (current) use of insulin: Secondary | ICD-10-CM | POA: Diagnosis not present

## 2018-01-01 DIAGNOSIS — E119 Type 2 diabetes mellitus without complications: Secondary | ICD-10-CM | POA: Diagnosis not present

## 2018-01-01 DIAGNOSIS — Z6838 Body mass index (BMI) 38.0-38.9, adult: Secondary | ICD-10-CM | POA: Diagnosis not present

## 2018-01-01 DIAGNOSIS — Z79899 Other long term (current) drug therapy: Secondary | ICD-10-CM | POA: Diagnosis not present

## 2018-01-01 DIAGNOSIS — L508 Other urticaria: Secondary | ICD-10-CM | POA: Diagnosis not present

## 2018-01-13 DIAGNOSIS — E538 Deficiency of other specified B group vitamins: Secondary | ICD-10-CM | POA: Diagnosis not present

## 2018-01-20 DIAGNOSIS — E538 Deficiency of other specified B group vitamins: Secondary | ICD-10-CM | POA: Diagnosis not present

## 2018-01-27 DIAGNOSIS — D509 Iron deficiency anemia, unspecified: Secondary | ICD-10-CM | POA: Diagnosis not present

## 2018-01-30 DIAGNOSIS — Z794 Long term (current) use of insulin: Secondary | ICD-10-CM | POA: Diagnosis not present

## 2018-01-30 DIAGNOSIS — E119 Type 2 diabetes mellitus without complications: Secondary | ICD-10-CM | POA: Diagnosis not present

## 2018-02-03 DIAGNOSIS — E538 Deficiency of other specified B group vitamins: Secondary | ICD-10-CM | POA: Diagnosis not present

## 2018-02-07 DIAGNOSIS — E119 Type 2 diabetes mellitus without complications: Secondary | ICD-10-CM | POA: Diagnosis not present

## 2018-02-07 DIAGNOSIS — Z794 Long term (current) use of insulin: Secondary | ICD-10-CM | POA: Diagnosis not present

## 2018-02-10 DIAGNOSIS — E538 Deficiency of other specified B group vitamins: Secondary | ICD-10-CM | POA: Diagnosis not present

## 2018-03-24 DIAGNOSIS — E538 Deficiency of other specified B group vitamins: Secondary | ICD-10-CM | POA: Diagnosis not present

## 2018-04-24 DIAGNOSIS — E538 Deficiency of other specified B group vitamins: Secondary | ICD-10-CM | POA: Diagnosis not present

## 2018-05-14 DIAGNOSIS — L508 Other urticaria: Secondary | ICD-10-CM | POA: Diagnosis not present

## 2018-05-27 DIAGNOSIS — E538 Deficiency of other specified B group vitamins: Secondary | ICD-10-CM | POA: Diagnosis not present

## 2018-07-01 DIAGNOSIS — Z794 Long term (current) use of insulin: Secondary | ICD-10-CM | POA: Diagnosis not present

## 2018-07-01 DIAGNOSIS — E669 Obesity, unspecified: Secondary | ICD-10-CM | POA: Diagnosis not present

## 2018-07-01 DIAGNOSIS — L508 Other urticaria: Secondary | ICD-10-CM | POA: Diagnosis not present

## 2018-07-01 DIAGNOSIS — E119 Type 2 diabetes mellitus without complications: Secondary | ICD-10-CM | POA: Diagnosis not present

## 2018-07-01 DIAGNOSIS — Z79899 Other long term (current) drug therapy: Secondary | ICD-10-CM | POA: Diagnosis not present

## 2018-08-15 ENCOUNTER — Encounter: Payer: Self-pay | Admitting: Allergy

## 2018-08-15 ENCOUNTER — Ambulatory Visit (INDEPENDENT_AMBULATORY_CARE_PROVIDER_SITE_OTHER): Payer: Medicare Other | Admitting: Allergy

## 2018-08-15 VITALS — BP 134/76 | HR 72 | Temp 97.8°F | Resp 16 | Ht 63.5 in | Wt 224.0 lb

## 2018-08-15 DIAGNOSIS — L508 Other urticaria: Secondary | ICD-10-CM | POA: Diagnosis not present

## 2018-08-15 DIAGNOSIS — T783XXD Angioneurotic edema, subsequent encounter: Secondary | ICD-10-CM | POA: Diagnosis not present

## 2018-08-15 DIAGNOSIS — Z872 Personal history of diseases of the skin and subcutaneous tissue: Secondary | ICD-10-CM | POA: Diagnosis not present

## 2018-08-15 MED ORDER — FAMOTIDINE 20 MG PO TABS: 20.0000 mg | ORAL_TABLET | Freq: Two times a day (BID) | ORAL | 5 refills | Status: DC

## 2018-08-15 MED ORDER — CETIRIZINE HCL 10 MG PO TABS: 10.0000 mg | ORAL_TABLET | Freq: Two times a day (BID) | ORAL | 5 refills | Status: DC

## 2018-08-15 MED ORDER — MONTELUKAST SODIUM 10 MG PO TABS
10.0000 mg | ORAL_TABLET | Freq: Every day | ORAL | 5 refills | Status: DC
Start: 2018-08-15 — End: 2019-02-09

## 2018-08-15 NOTE — Progress Notes (Signed)
New Patient Note  RE: Barbara [name] MRN: [id number] DOB: 05-11-1951 Date of Office Visit: 08/15/2018  Referring provider: Delrae Rend, MD Primary care provider: [facility], Herschell Dimes, MD  Chief Complaint: Hives  History of present illness: Barbara [name] is a 67 y.o. female presenting today for evaluation of hives. HPI obtained by Dr. Sherry Ruffing, internal med resident and confirmed with pt by myself.    Patient has been experiencing hives for about 7-8 years. She has pictures that show raised, red papules, she reports that they are itchy. She reports that the occur every day around 6 o' clock, occurs everywhere on her body including her arms, legs, back, chest, neck, lips, face. She reports that she does not have them now, she took 4  benadryl tabs last night. She has been taking benadryl for years, she takes them daily, has been taking 4 for the last 2 weeks and reports that they help. She reports that they start when she gets home in the evening, she reports that they will last all night if she doesn't take her pills. Had some nausea, palpitations, feels hot, possible subjective fever, and headaches with the hives. Has some joint pains in her thumbs and fingers. Has left shoulder joint pain for about 1 year. Hips and knees are fine.  Denies chills, vomiting, chest pain, shortness of breath, wheezing. She reports that she just wants some relief, it is beginning to be stressful. She reports that they occur even when she is not at home like when she is on a cruise or when she is on vacation. Laundry detergent is Tide and Purex. Uses zest soap on her body.   She reports that she has also has some throat swelling, where she feels like there is a lump in the back of her throat, swallowing is difficult, lasts all night. She reports the last time was last week and he had some difficulty breathing at that time. Took benadryl which helped. She reports that the throat swelling occurred about 3 times  in the last month.   No seasonal relation. Denies runny, sneezing, teary eyes, itchy eyes, sinus or nasal congestion. She has never had seasonal allergies.   Has been tested for allergies before about 2 years ago, she had blood [place] at [place] clinic in [place]", everything was negative for allergies. She had the hives at that time and was unable to get the skin testing done.   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 Lives in a house, lived there for 20+ years. Hardwood floors, no carpet. Lives with daughter(44) and granddaughter (age 57). She has 2 dogs, and has had them for awhile. Does not think the hives occur when she is playing with them.   PMH: diabetes type 2. Denies childhood asthma, eczema or other skin conditions.  Family: Grand-daugther has allergies to shellfish. Nieces have MS. Denies  Any autoimmune disease like Lupus, Sjogrens, rheumatoid arthritis. Niece is albino and have vitiligo.  Social: Denies smoking. Drinks wine, 1 glass a couple times a week, mostly on the weekend. Denies other drug use.    Review of systems: Review of Systems  Constitutional: Negative for chills, fever and malaise/fatigue.  HENT: Negative for congestion, ear discharge, nosebleeds and sore throat.   Eyes: Negative for pain, discharge and redness.  Respiratory: Negative for cough, shortness of breath and wheezing.   Cardiovascular: Negative for chest pain.  Gastrointestinal: Negative for abdominal pain, constipation, diarrhea, heartburn, nausea and vomiting.  Musculoskeletal: Negative for joint  pain.  Skin: Positive for itching and rash.  Neurological: Negative for headaches.    All other systems negative unless noted above in HPI  Past medical history: Past Medical History:  Diagnosis Date  . Anemia   . Diabetes mellitus    TYPE 2  . Hyperlipidemia   . Obesity   . Postmenopausal   . Urticaria   . Vitamin D deficiency disease     Past surgical history: Past Surgical History:    Procedure Laterality Date  . APPENDECTOMY    . COLONOSCOPY  11/22/2011   Procedure: COLONOSCOPY;  Surgeon: Owens Loffler, MD;  Location: [facility];  Service: Endoscopy;  Laterality: N/A;  . CYSTECTOMY     Rt falopian tube  . KNEE SURGERY     right knee/ 2009  . WISDOM TOOTH EXTRACTION      Family history:  Family History  Problem Relation Age of Onset  . Colon cancer Brother 108  . Cancer Brother        COLON  . Diabetes Brother   . Colon cancer Sister 4  . Cancer Sister        COLON  . Diabetes Sister   . Heart disease Mother        HEART ATTACK  . Heart disease Father        HEART ATTACK  . Thyroid disease Daughter   . Asthma Grandchild   . Anesthesia problems Neg Hx   . Malignant hyperthermia Neg Hx   . Stomach cancer Neg Hx     Social history: Lives in a home without carpeting with gas heating and central cooling.  Dogs in the home.  There is no concern for water damage, mildew or roaches in the home.  Denies a smoking history.    Medication List: Allergies as of 08/15/2018   No Known Allergies     Medication List        Accurate as of 08/15/18  5:15 PM. Always use your most recent med list.          aspirin 81 MG tablet Take 81 mg by mouth daily.   BASAGLAR KWIKPEN 100 UNIT/ML Sopn Basaglar KwikPen U-100 Insulin 100 unit/mL (3 mL) subcutaneous   cetirizine 10 MG tablet Commonly known as:  ZYRTEC Take 1 tablet (10 mg total) by mouth 2 (two) times daily.   diphenhydrAMINE 25 MG tablet Commonly known as:  BENADRYL Take 25 mg by mouth every 6 (six) hours as needed.   famotidine 20 MG tablet Commonly known as:  PEPCID Take 1 tablet (20 mg total) by mouth 2 (two) times daily.   JANUMET 50-1000 MG tablet Generic drug:  sitaGLIPtin-metformin Janumet 50 mg-1,000 mg tablet   KAZANO 12.03-999 MG Tabs Generic drug:  Alogliptin-metFORMIN HCl Take by mouth 2 (two) times daily.   Magnesium 250 MG Tabs Take by mouth.   montelukast 10 MG  tablet Commonly known as:  SINGULAIR Take 1 tablet (10 mg total) by mouth at bedtime.   ONE TOUCH ULTRA TEST test strip Generic drug:  glucose blood by Misc.(Non-Drug; Combo Route) route.   vitamin B-12 1000 MCG tablet Commonly known as:  CYANOCOBALAMIN Take 1,000 mcg by mouth daily.   Vitamin D-3 1000 units Caps Take by mouth. Take 3 tablet bid       Known medication allergies: No Known Allergies   Physical examination: Blood pressure 134/76, pulse 72, temperature 97.8 F (36.6 C), temperature source Oral, resp. rate 16, height 5' 3.5" (1.613 m), weight  224 lb (101.6 kg), SpO2 97 %.  General: Alert, interactive, in no acute distress. HEENT: PERRLA, TMs pearly gray, turbinates minimally edematous without discharge, post-pharynx non erythematous. Neck: Supple without lymphadenopathy. Lungs: Clear to auscultation without wheezing, rhonchi or rales. {no increased work of breathing. CV: Normal S1, S2 without murmurs. Abdomen: Nondistended, nontender. Skin: Warm and dry, without lesions or rashes. Extremities:  No clubbing, cyanosis or edema. Neuro:   Grossly intact.  Diagnositics/Labs:  Allergy testing: deferred due to recent antihistamine use.     Assessment and plan:   Hives, chronic   - at this time etiology of hives and swelling is unknown.  Hives can be caused by a variety of different triggers including illness/infection, foods, medications, stings, exercise, pressure, vibrations, extremes of temperature to name a few however majority of the time there is no identifiable trigger.  Your symptoms have been ongoing for >6 weeks making this chronic thus will obtain labwork to evaluate: CBC w diff, CMP, tryptase, hive panel, environmental panel, alpha-gal panel, inflammatory markers   - recommend the following medication regimen:  Zyrtec 10mg  1 tablet twice a day, Pepcid 20mg  1 tablet twice a day and Singulair 10mg  1 tablet daily   - we did discuss Xolair monthly injections  as an added therapy in control of hives if above regimen is not effective enough.  Provided with Xolair brochure today  Follow-up 2-3 months or sooner if needed  I appreciate the opportunity to take part in Faye's care. Please do not hesitate to contact me with questions.  Sincerely,   Prudy Feeler, MD Allergy/Immunology [facility]

## 2018-08-15 NOTE — Patient Instructions (Addendum)
Hives, chronic   - at this time etiology of hives and swelling is unknown.  Hives can be caused by a variety of different triggers including illness/infection, foods, medications, stings, exercise, pressure, vibrations, extremes of temperature to name a few however majority of the time there is no identifiable trigger.  Your symptoms have been ongoing for >6 weeks making this chronic thus will obtain labwork to evaluate: CBC w diff, CMP, tryptase, hive panel, environmental panel, alpha-gal panel, inflammatory markers   - recommend the following medication regimen:  Zyrtec 10mg  1 tablet twice a day, Pepcid 20mg  1 tablet twice a day and Singulair 10mg  1 tablet daily   - we did discuss Xolair monthly injections as an added therapy in control of hives if above regimen is not effective enough.  Provided with Xolair brochure today  Follow-up 2-3 months or sooner if needed

## 2018-08-22 LAB — ALLERGENS W/TOTAL IGE AREA 2
Alternaria Alternata IgE: <0.1 kU/L
Aspergillus Fumigatus IgE: <0.1 kU/L
Bermuda Grass IgE: <0.1 kU/L
Cladosporium Herbarum IgE: <0.1 kU/L
Cockroach, German IgE: <0.1 kU/L
Common Silver Birch IgE: <0.1 kU/L
Cottonwood IgE: <0.1 kU/L
D Farinae IgE: <0.1 kU/L
Dog Dander IgE: <0.1 kU/L
Elm, American IgE: <0.1 kU/L
IgE (Immunoglobulin E), Serum: 24 IU/mL (ref 6–495)
Johnson Grass IgE: <0.1 kU/L
Maple/Box Elder IgE: <0.1 kU/L
Pecan, Hickory IgE: <0.1 kU/L
Penicillium Chrysogen IgE: <0.1 kU/L
Pigweed, Rough IgE: <0.1 kU/L
Ragweed, Short IgE: 0.15 kU/L — AB
Sheep Sorrel IgE Qn: <0.1 kU/L
Timothy Grass IgE: <0.1 kU/L
White Mulberry IgE: <0.1 kU/L

## 2018-08-22 LAB — CBC WITH DIFFERENTIAL/PLATELET
Basophils Absolute: 0 10*3/uL (ref 0.0–0.2)
Basos: 0 %
EOS (ABSOLUTE): 0.2 10*3/uL (ref 0.0–0.4)
EOS: 4 %
Hematocrit: 37.4 % (ref 34.0–46.6)
Hemoglobin: 11.4 g/dL (ref 11.1–15.9)
Immature Grans (Abs): 0 10*3/uL (ref 0.0–0.1)
Immature Granulocytes: 0 %
LYMPHS ABS: 2.3 10*3/uL (ref 0.7–3.1)
Lymphs: 38 %
MCH: 25.9 pg — AB (ref 26.6–33.0)
MCHC: 30.5 g/dL — AB (ref 31.5–35.7)
MCV: 85 fL (ref 79–97)
MONOS ABS: 0.2 10*3/uL (ref 0.1–0.9)
Monocytes: 4 %
Neutrophils Absolute: 3.4 10*3/uL (ref 1.4–7.0)
Neutrophils: 54 %
Platelets: 385 10*3/uL (ref 150–450)
RBC: 4.41 x10E6/uL (ref 3.77–5.28)
RDW: 14.8 % (ref 12.3–15.4)
WBC: 6.2 10*3/uL (ref 3.4–10.8)

## 2018-08-22 LAB — COMPREHENSIVE METABOLIC PANEL
ALT: 37 IU/L — AB (ref 0–32)
AST: 32 IU/L (ref 0–40)
Albumin/Globulin Ratio: 1.8 (ref 1.2–2.2)
Albumin: 4.2 g/dL (ref 3.6–4.8)
Alkaline Phosphatase: 94 IU/L (ref 39–117)
BUN / CREAT RATIO: 14 (ref 12–28)
BUN: 10 mg/dL (ref 8–27)
Bilirubin Total: 0.3 mg/dL (ref 0.0–1.2)
CHLORIDE: 100 mmol/L (ref 96–106)
CO2: 26 mmol/L (ref 20–29)
CREATININE: 0.73 mg/dL (ref 0.57–1.00)
Calcium: 9.5 mg/dL (ref 8.7–10.3)
GFR, EST AFRICAN AMERICAN: 97 mL/min/{1.73_m2} (ref >59)
GFR, EST NON AFRICAN AMERICAN: 84 mL/min/{1.73_m2} (ref >59)
GLUCOSE: 100 mg/dL — AB (ref 65–99)
Globulin, Total: 2.4 g/dL (ref 1.5–4.5)
Potassium: 4.8 mmol/L (ref 3.5–5.2)
Sodium: 141 mmol/L (ref 134–144)
Total Protein: 6.6 g/dL (ref 6.0–8.5)

## 2018-08-22 LAB — CHRONIC URTICARIA: cu index: 22.9 — ABNORMAL HIGH (ref <10)

## 2018-08-22 LAB — THYROID ANTIBODIES
Thyroglobulin Antibody: <1 IU/mL (ref 0.0–0.9)
Thyroperoxidase Ab SerPl-aCnc: <6 IU/mL (ref 0–34)

## 2018-08-22 LAB — ALPHA-GAL PANEL
Class Interpretation: 0
Class Interpretation: 0
LAMB CLASS INTERPRETATION: 0
Lamb/Mutton (Ovis spp) IgE: <0.1 kU/L (ref <0.35)

## 2018-08-22 LAB — TRYPTASE: TRYPTASE: 5.4 ug/L (ref 2.2–13.2)

## 2018-08-22 LAB — SEDIMENTATION RATE: SED RATE: 17 mm/h (ref 0–40)

## 2018-09-26 DIAGNOSIS — Z23 Encounter for immunization: Secondary | ICD-10-CM | POA: Diagnosis not present

## 2018-09-30 DIAGNOSIS — L501 Idiopathic urticaria: Secondary | ICD-10-CM | POA: Diagnosis not present

## 2018-09-30 DIAGNOSIS — G4733 Obstructive sleep apnea (adult) (pediatric): Secondary | ICD-10-CM | POA: Diagnosis not present

## 2018-09-30 DIAGNOSIS — Z23 Encounter for immunization: Secondary | ICD-10-CM | POA: Diagnosis not present

## 2018-09-30 DIAGNOSIS — E119 Type 2 diabetes mellitus without complications: Secondary | ICD-10-CM | POA: Diagnosis not present

## 2018-10-06 DIAGNOSIS — E119 Type 2 diabetes mellitus without complications: Secondary | ICD-10-CM | POA: Diagnosis not present

## 2018-10-06 DIAGNOSIS — H2513 Age-related nuclear cataract, bilateral: Secondary | ICD-10-CM | POA: Diagnosis not present

## 2018-10-22 DIAGNOSIS — Z01419 Encounter for gynecological examination (general) (routine) without abnormal findings: Secondary | ICD-10-CM | POA: Diagnosis not present

## 2018-10-22 DIAGNOSIS — Z6838 Body mass index (BMI) 38.0-38.9, adult: Secondary | ICD-10-CM | POA: Diagnosis not present

## 2018-10-22 DIAGNOSIS — Z1231 Encounter for screening mammogram for malignant neoplasm of breast: Secondary | ICD-10-CM | POA: Diagnosis not present

## 2018-11-04 ENCOUNTER — Encounter: Payer: Self-pay | Admitting: Pulmonary Disease

## 2018-11-04 ENCOUNTER — Ambulatory Visit (INDEPENDENT_AMBULATORY_CARE_PROVIDER_SITE_OTHER): Payer: Medicare Other | Admitting: Pulmonary Disease

## 2018-11-04 VITALS — BP 122/74 | HR 77 | Ht 64.0 in | Wt 221.0 lb

## 2018-11-04 DIAGNOSIS — R0683 Snoring: Secondary | ICD-10-CM

## 2018-11-04 DIAGNOSIS — G478 Other sleep disorders: Secondary | ICD-10-CM

## 2018-11-04 NOTE — Addendum Note (Signed)
Addended by: Shirlee More R on: 11/04/2018 11:58 AM   Modules accepted: Orders

## 2018-11-04 NOTE — Progress Notes (Signed)
Barbara [name]    [id number]    11/08/1951  Primary Care Physician:Shamleffer, Melanie Crazier, MD  Referring Physician: Buzzy Han, MD [place], [place] [address]  Chief complaint:   Patient with nonrestorative sleep, multiple awakenings  HPI:  Concern for obstructive sleep apnea Usually goes to bed between 9 and 10 PM, takes are about 50 minutes to fall asleep, usually wakes up up to 3 times during the night-on occasion sees the bathroom Final awakening time of 5:45 AM She feels she is rested when she wakes up on most days Occasional dryness of her mouth in the mornings Occasional night sweats No headaches Memory is good  She states she is not aware of snoring  Denies significant sleepiness during the day  Occasional shortness of breath/cough  Outpatient Encounter Medications as of 11/04/2018  Medication Sig  . Alogliptin-metFORMIN HCl (KAZANO) 12.03-999 MG TABS Take by mouth 2 (two) times daily.  Marland Kitchen aspirin 81 MG tablet Take 81 mg by mouth daily.  . cetirizine (ZYRTEC) 10 MG tablet Take 1 tablet (10 mg total) by mouth 2 (two) times daily.  . Cholecalciferol (VITAMIN D-3) 1000 UNITS CAPS Take by mouth. Take 3 tablet bid  . diphenhydrAMINE (BENADRYL) 25 MG tablet Take 25 mg by mouth every 6 (six) hours as needed.  . famotidine (PEPCID) 20 MG tablet Take 1 tablet (20 mg total) by mouth 2 (two) times daily.  Marland Kitchen glucose blood (ONE TOUCH ULTRA TEST) test strip by Misc.(Non-Drug; Combo Route) route.  . Insulin Glargine (BASAGLAR KWIKPEN) 100 UNIT/ML SOPN Basaglar KwikPen U-100 Insulin 100 unit/mL (3 mL) subcutaneous  . Magnesium 250 MG TABS Take by mouth.  . montelukast (SINGULAIR) 10 MG tablet Take 1 tablet (10 mg total) by mouth at bedtime.  . sitaGLIPtin-metformin (JANUMET) 50-1000 MG tablet Janumet 50 mg-1,000 mg tablet  . vitamin B-12 (CYANOCOBALAMIN) 1000 MCG tablet Take 1,000 mcg by mouth daily.   No facility-administered  encounter medications on file as of 11/04/2018.     Allergies as of 11/04/2018  . (No Known Allergies)    Past Medical History:  Diagnosis Date  . Anemia   . Diabetes mellitus    TYPE 2  . Hyperlipidemia   . Obesity   . Postmenopausal   . Urticaria   . Vitamin D deficiency disease     Past Surgical History:  Procedure Laterality Date  . APPENDECTOMY    . COLONOSCOPY  11/22/2011   Procedure: COLONOSCOPY;  Surgeon: Owens Loffler, MD;  Location: [facility];  Service: Endoscopy;  Laterality: N/A;  . CYSTECTOMY     Rt falopian tube  . KNEE SURGERY     right knee/ 2009  . WISDOM TOOTH EXTRACTION      Family History  Problem Relation Age of Onset  . Colon cancer Brother 20  . Cancer Brother        COLON  . Diabetes Brother   . Colon cancer Sister 53  . Cancer Sister        COLON  . Diabetes Sister   . Heart disease Mother        HEART ATTACK  . Heart disease Father        HEART ATTACK  . Thyroid disease Daughter   . Asthma Grandchild   . Anesthesia problems Neg Hx   . Malignant hyperthermia Neg Hx   . Stomach cancer Neg Hx     Social History   Socioeconomic History  . Marital  status: Divorced    Spouse name: Not on file  . Number of children: 2  . Years of education: Not on file  . Highest education level: Not on file  Occupational History  . Occupation: Teacher, early years/pre  Social Needs  . Financial resource strain: Not on file  . Food insecurity:    Worry: Not on file    Inability: Not on file  . Transportation needs:    Medical: Not on file    Non-medical: Not on file  Tobacco Use  . Smoking status: Never Smoker  . Smokeless tobacco: Never Used  Substance and Sexual Activity  . Alcohol use: No  . Drug use: No  . Sexual activity: Not on file  Lifestyle  . Physical activity:    Days per week: Not on file    Minutes per session: Not on file  . Stress: Not on file  Relationships  . Social connections:    Talks on phone: Not on file    Gets  together: Not on file    Attends religious service: Not on file    Active member of club or organization: Not on file    Attends meetings of clubs or organizations: Not on file    Relationship status: Not on file  . Intimate partner violence:    Fear of current or ex partner: Not on file    Emotionally abused: Not on file    Physically abused: Not on file    Forced sexual activity: Not on file  Other Topics Concern  . Not on file  Social History Narrative  . Not on file    Review of Systems  Constitutional: Negative.   HENT: Negative.   Eyes: Negative.   Respiratory: Negative.  Negative for apnea.   Cardiovascular: Negative.   Gastrointestinal: Negative.   Psychiatric/Behavioral: Positive for sleep disturbance.    Vitals:   11/04/18 1022  BP: 122/74  Pulse: 77  SpO2: 93%     Physical Exam  Constitutional: She appears well-developed and well-nourished.  Overweight  HENT:  Head: Atraumatic.  Mallampati 3  Eyes: Pupils are equal, round, and reactive to light. Conjunctivae are normal. Right eye exhibits no discharge. Left eye exhibits no discharge.  Neck: Normal range of motion. Neck supple. No tracheal deviation present. No thyromegaly present.  Cardiovascular: Normal rate and regular rhythm.  Pulmonary/Chest: Effort normal and breath sounds normal. No respiratory distress. She has no wheezes.  Abdominal: Soft. Bowel sounds are normal. She exhibits no distension. There is no tenderness.     Results of the Epworth flowsheet 11/04/2018  Sitting and reading 0  Watching TV 1  Sitting, inactive in a public place (e.g. a theatre or a meeting) 0  As a passenger in a car for an hour without a break 0  Lying down to rest in the afternoon when circumstances permit 2  Sitting and talking to someone 0  Sitting quietly after a lunch without alcohol 0  In a car, while stopped for a few minutes in traffic 0  Total score 3   Assessment:  Mild to moderate probability of  significant sleep disordered breathing  Nonrestorative sleep -Multiple awakenings at night -She is not aware of snoring or gasping respirations at night   Plan/Recommendations: Pathophysiology of sleep disordered breathing discussed with the patient  Treatment options for sleep disordered breathing discussed with the patient  Importance of weight maintenance discussed with the patient  We will schedule the patient for a home  sleep study and follow-up following reviewing the study  She will be scheduled for follow-up for about 3 months  Sherrilyn Rist MD  Pulmonary and Critical Care 11/04/2018, 10:36 AM  CC: Buzzy Han*

## 2018-11-04 NOTE — Patient Instructions (Signed)
Moderate probability of significant sleep disordered breathing  We will schedule you for home sleep study We will update you with findings on the study once reviewed  Continue with regular exercises and weight maintenance measures   Sleep Apnea Sleep apnea is a condition that affects breathing. People with sleep apnea have moments during sleep when their breathing pauses briefly or gets shallow. Sleep apnea can cause these symptoms:  Trouble staying asleep.  Sleepiness or tiredness during the day.  Irritability.  Loud snoring.  Morning headaches.  Trouble concentrating.  Forgetting things.  Less interest in sex.  Being sleepy for no reason.  Mood swings.  Personality changes.  Depression.  Waking up a lot during the night to pee (urinate).  Dry mouth.  Sore throat.  Follow these instructions at home:  Make any changes in your routine that your doctor recommends.  Eat a healthy, well-balanced diet.  Take over-the-counter and prescription medicines only as told by your doctor.  Avoid using alcohol, calming medicines (sedatives), and narcotic medicines.  Take steps to lose weight if you are overweight.  If you were given a machine (device) to use while you sleep, use it only as told by your doctor.  Do not use any tobacco products, such as cigarettes, chewing tobacco, and e-cigarettes. If you need help quitting, ask your doctor.  Keep all follow-up visits as told by your doctor. This is important. Contact a doctor if:  The machine that you were given to use during sleep is uncomfortable or does not seem to be working.  Your symptoms do not get better.  Your symptoms get worse. Get help right away if:  Your chest hurts.  You have trouble breathing in enough air (shortness of breath).  You have an uncomfortable feeling in your back, arms, or stomach.  You have trouble talking.  One side of your body feels weak.  A part of your face is hanging  down (drooping). These symptoms may be an emergency. Do not wait to see if the symptoms will go away. Get medical help right away. Call your local emergency services (911 in the [place] Do not drive yourself to the hospital. This information is not intended to replace advice given to you by your health care provider. Make sure you discuss any questions you have with your health care provider. Document Released: 08/21/2008 Document Revised: 07/08/2016 Document Reviewed: 08/22/2015 Elsevier Interactive Patient Education  [employer].

## 2018-12-02 DIAGNOSIS — G4733 Obstructive sleep apnea (adult) (pediatric): Secondary | ICD-10-CM | POA: Diagnosis not present

## 2018-12-02 DIAGNOSIS — R32 Unspecified urinary incontinence: Secondary | ICD-10-CM | POA: Diagnosis not present

## 2018-12-02 DIAGNOSIS — Z23 Encounter for immunization: Secondary | ICD-10-CM | POA: Diagnosis not present

## 2018-12-02 DIAGNOSIS — L501 Idiopathic urticaria: Secondary | ICD-10-CM | POA: Diagnosis not present

## 2018-12-02 DIAGNOSIS — E538 Deficiency of other specified B group vitamins: Secondary | ICD-10-CM | POA: Diagnosis not present

## 2018-12-02 DIAGNOSIS — E119 Type 2 diabetes mellitus without complications: Secondary | ICD-10-CM | POA: Diagnosis not present

## 2018-12-09 DIAGNOSIS — E538 Deficiency of other specified B group vitamins: Secondary | ICD-10-CM | POA: Diagnosis not present

## 2018-12-09 DIAGNOSIS — L501 Idiopathic urticaria: Secondary | ICD-10-CM | POA: Diagnosis not present

## 2018-12-09 DIAGNOSIS — Z1211 Encounter for screening for malignant neoplasm of colon: Secondary | ICD-10-CM | POA: Diagnosis not present

## 2018-12-09 DIAGNOSIS — E119 Type 2 diabetes mellitus without complications: Secondary | ICD-10-CM | POA: Diagnosis not present

## 2018-12-09 DIAGNOSIS — Z136 Encounter for screening for cardiovascular disorders: Secondary | ICD-10-CM | POA: Diagnosis not present

## 2018-12-15 DIAGNOSIS — G4733 Obstructive sleep apnea (adult) (pediatric): Secondary | ICD-10-CM | POA: Diagnosis not present

## 2018-12-16 ENCOUNTER — Other Ambulatory Visit: Payer: Self-pay | Admitting: *Deleted

## 2018-12-16 DIAGNOSIS — R0683 Snoring: Secondary | ICD-10-CM

## 2018-12-16 DIAGNOSIS — G478 Other sleep disorders: Secondary | ICD-10-CM

## 2018-12-24 ENCOUNTER — Telehealth: Payer: Self-pay | Admitting: Pulmonary Disease

## 2018-12-24 DIAGNOSIS — G4733 Obstructive sleep apnea (adult) (pediatric): Secondary | ICD-10-CM | POA: Diagnosis not present

## 2018-12-24 NOTE — Telephone Encounter (Signed)
Dr. Ander Slade has reviewed the home sleep test this showed Mild obstructive sleep apnea .   Recommendations   Treatment options are CPAP with the settings auto 5 to 15.    Weight loss measures .   Sleep position optimization by encouraging sleep in the lateral poittion , eveating the head of the bed by 30 degrees may help.    Regular exercise will help promote good quality sleep  .    Advise against driving while sleepy & against medication with sedative side effects.    Make appointment for 3 months for compliance with download with Dr. Ander Slade.   Called and left patient a message.

## 2018-12-29 NOTE — Telephone Encounter (Signed)
lmom 

## 2019-01-06 DIAGNOSIS — J209 Acute bronchitis, unspecified: Secondary | ICD-10-CM | POA: Diagnosis not present

## 2019-01-06 DIAGNOSIS — R197 Diarrhea, unspecified: Secondary | ICD-10-CM | POA: Diagnosis not present

## 2019-01-07 DIAGNOSIS — E119 Type 2 diabetes mellitus without complications: Secondary | ICD-10-CM | POA: Diagnosis not present

## 2019-01-07 DIAGNOSIS — Z5181 Encounter for therapeutic drug level monitoring: Secondary | ICD-10-CM | POA: Diagnosis not present

## 2019-01-07 DIAGNOSIS — E669 Obesity, unspecified: Secondary | ICD-10-CM | POA: Diagnosis not present

## 2019-01-07 DIAGNOSIS — Z794 Long term (current) use of insulin: Secondary | ICD-10-CM | POA: Diagnosis not present

## 2019-01-07 DIAGNOSIS — L508 Other urticaria: Secondary | ICD-10-CM | POA: Diagnosis not present

## 2019-01-08 NOTE — Telephone Encounter (Signed)
lmom  X 2

## 2019-01-19 NOTE — Telephone Encounter (Signed)
Patient is aware but would like to hold off on cpap therapy at this time and make the other suggestion at this time.

## 2019-02-07 ENCOUNTER — Other Ambulatory Visit: Payer: Self-pay | Admitting: Allergy

## 2019-02-17 ENCOUNTER — Ambulatory Visit: Payer: Medicare Other | Admitting: Pulmonary Disease

## 2019-02-27 ENCOUNTER — Other Ambulatory Visit: Payer: Self-pay | Admitting: Allergy

## 2019-03-08 ENCOUNTER — Other Ambulatory Visit: Payer: Self-pay | Admitting: Allergy

## 2019-03-09 NOTE — Telephone Encounter (Signed)
Courtesy refill already given 01/2019 of montelukast- pt needs ov.

## 2019-03-31 ENCOUNTER — Other Ambulatory Visit: Payer: Self-pay | Admitting: Allergy

## 2019-04-14 DIAGNOSIS — Z79899 Other long term (current) drug therapy: Secondary | ICD-10-CM | POA: Diagnosis not present

## 2019-04-14 DIAGNOSIS — Z794 Long term (current) use of insulin: Secondary | ICD-10-CM | POA: Diagnosis not present

## 2019-04-14 DIAGNOSIS — E669 Obesity, unspecified: Secondary | ICD-10-CM | POA: Diagnosis not present

## 2019-04-14 DIAGNOSIS — E119 Type 2 diabetes mellitus without complications: Secondary | ICD-10-CM | POA: Diagnosis not present

## 2019-04-14 DIAGNOSIS — L508 Other urticaria: Secondary | ICD-10-CM | POA: Diagnosis not present

## 2019-04-16 ENCOUNTER — Encounter: Payer: Self-pay | Admitting: Allergy

## 2019-04-16 ENCOUNTER — Other Ambulatory Visit: Payer: Self-pay

## 2019-04-16 ENCOUNTER — Ambulatory Visit (INDEPENDENT_AMBULATORY_CARE_PROVIDER_SITE_OTHER): Payer: Medicare Other | Admitting: Allergy

## 2019-04-16 DIAGNOSIS — T783XXD Angioneurotic edema, subsequent encounter: Secondary | ICD-10-CM

## 2019-04-16 DIAGNOSIS — L508 Other urticaria: Secondary | ICD-10-CM | POA: Diagnosis not present

## 2019-04-16 MED ORDER — MONTELUKAST SODIUM 10 MG PO TABS: 10.0000 mg | ORAL_TABLET | Freq: Every day | ORAL | 5 refills | Status: DC

## 2019-04-16 MED ORDER — CETIRIZINE HCL 10 MG PO TABS: 10.0000 mg | ORAL_TABLET | Freq: Two times a day (BID) | ORAL | 5 refills | Status: DC

## 2019-04-16 MED ORDER — FAMOTIDINE 20 MG PO TABS: 20.0000 mg | ORAL_TABLET | Freq: Two times a day (BID) | ORAL | 5 refills | Status: DC

## 2019-04-16 NOTE — Progress Notes (Signed)
Start time:  [place] Time:  1130 Where are you located:  home Do you give [place] permission to bill your insurance:  yes Are you signed up for my chart:  No, sent link  Hives have been better with medication, if she does not take the medication, she breaks out.  Pt states that she was given a prescription for her ears, states that it feels like she is swimming.  Not sure of the name of it.

## 2019-04-16 NOTE — Patient Instructions (Addendum)
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  This leads to the development of chronic and recurrent hives and/or swelling. Lab work also revealed a very low level IgE to ragweed pollen.   -Hives have been controlled with the high-dose antihistamine regimen.     -Continue the following medication regimen:  Zyrtec 10mg  1 tablet twice a day, Pepcid 20mg  1 tablet twice a day and Singulair 10mg  1 tablet daily   - we have discussed the option of Xolair monthly injections as an added therapy in control of hives if above regimen is not effective enough.    Follow-up 6 months or sooner if needed

## 2019-04-16 NOTE — Progress Notes (Signed)
RE: Barbara [name] MRN: [id number] DOB: 10-Oct-1951 Date of Telemedicine Visit: 04/16/2019  Referring provider: Nanci Pina* Primary care provider: Shamleffer, Melanie Crazier, MD  Chief Complaint: Urticaria   Telemedicine Follow Up Visit via Telephone: I connected with Barbara [name] for a follow up on 04/16/19 by telephone and verified that I am speaking with the correct person using two identifiers.   I discussed the limitations, risks, security and privacy concerns of performing an evaluation and management service by telephone and the availability of in person appointments. I also discussed with the patient that there may be a patient responsible charge related to this service. The patient expressed understanding and agreed to proceed.  Patient is at home..  Provider is at the office.  Visit start time: 1112 Visit end time: [place] consent/check in by: Dorris Fetch Medical consent and medical assistant/nurse: Lisabeth Pick S  History of Present Illness: She is a 68 y.o. female, who is being followed for chronic urticaria. Her previous allergy office visit was on 08/15/2018 with Dr. Nelva Bush.   She states that since she has been on regimen started after her last visit that her hives have been controlled.  She states if she forgets and misses a day of her medications that her hives will return.  She is pleased with the current regimen of Zyrtec twice a day, Pepcid twice a day and singular daily.  We have discussed the option of starting Xolair which may help to decrease the amount of oral medications that she is taking but she is fine taking the higher dose any her antihistamine regimen as above. She denies any major health changes, there is a hospitalization since her last visit.  She does state that she developed a "swimming in the ear "sensation and she has seen her primary care in regards to this and she was started on a medication however she does not recall what this is.   Assessment and Plan: Barbara is a 68 y.o. 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  This leads to the development of chronic and recurrent hives and/or swelling. Lab work also revealed a very low level IgE to ragweed pollen.   -Hives have been controlled with the high-dose antihistamine regimen.  She has shown that if she does miss doses that her hives are returned this she will continue on her oral regimen for control of her hives.   -Continue the following medication regimen:  Zyrtec 10mg  1 tablet twice a day, Pepcid 20mg  1 tablet twice a day and Singulair 10mg  1 tablet daily   - we have discussed the option of Xolair monthly injections as an added therapy in control of hives if above regimen is not effective enough.    Follow-up 6 months or sooner if needed  Diagnostics: Component     Latest Ref Rng & Units 08/15/2018  IgE (Immunoglobulin E), Serum     6 - 495 IU/mL 24  D Pteronyssinus IgE     Class 0 kU/L <0.10  D Farinae IgE     Class 0 kU/L <0.10  Cat Dander IgE     Class 0 kU/L <0.10  Dog Dander IgE     Class 0 kU/L <0.10  [place] Grass IgE     Class 0 kU/L <0.10  Timothy Grass IgE  Class 0 kU/L <0.10  Johnson Grass IgE     Class 0 kU/L <0.10  Cockroach, German IgE     Class 0 kU/L <0.10  Penicillium Chrysogen IgE     Class 0 kU/L <0.10  Cladosporium Herbarum IgE     Class 0 kU/L <0.10  Aspergillus Fumigatus IgE     Class 0 kU/L <0.10  Alternaria Alternata IgE     Class 0 kU/L <0.10  Maple/Box Elder IgE     Class 0 kU/L <0.10  Common Silver Wendee Copp IgE     Class 0 kU/L <0.10  Cedar, Kyleigh IgE     Class 0 kU/L <0.10  Oak, White IgE     Class 0 kU/L <0.10  Elm, American IgE     Class 0 kU/L <0.10  Cottonwood IgE     Class 0 kU/L <0.10  Pecan, Hickory IgE     Class  0 kU/L <0.10  White Mulberry IgE     Class 0 kU/L <0.10  Ragweed, Short IgE     Class 0/I kU/L 0.15 (A)  Pigweed, Rough IgE     Class 0 kU/L <0.10  Sheep Sorrel IgE Qn     Class 0 kU/L <0.10  Mouse Urine IgE     Class 0 kU/L <0.10  WBC     3.4 - 10.8 x10E3/uL 6.2  RBC     3.77 - 5.28 x10E6/uL 4.41  Hemoglobin     11.1 - 15.9 g/dL 11.4  HCT     34.0 - 46.6 % 37.4  MCV     79 - 97 fL 85  MCH     26.6 - 33.0 pg 25.9 (L)  MCHC     31.5 - 35.7 g/dL 30.5 (L)  RDW     12.3 - 15.4 % 14.8  Platelets     150 - 450 x10E3/uL 385  Neutrophils     Not Estab. % 54  Lymphs     Not Estab. % 38  Monocytes     Not Estab. % 4  Eos     Not Estab. % 4  Basos     Not Estab. % 0  NEUT#     1.4 - 7.0 x10E3/uL 3.4  Lymphocyte #     0.7 - 3.1 x10E3/uL 2.3  Monocytes Absolute     0.1 - 0.9 x10E3/uL 0.2  EOS (ABSOLUTE)     0.0 - 0.4 x10E3/uL 0.2  Basophils Absolute     0.0 - 0.2 x10E3/uL 0.0  Immature Granulocytes     Not Estab. % 0  Immature Grans (Abs)     0.0 - 0.1 x10E3/uL 0.0  Glucose     65 - 99 mg/dL 100 (H)  BUN     8 - 27 mg/dL 10  Creatinine     0.57 - 1.00 mg/dL 0.73  GFR, Est Non African American     >59 mL/min/1.73 84  GFR, Est African American     >59 mL/min/1.73 97  BUN/Creatinine Ratio     12 - 28 14  Sodium     134 - 144 mmol/L 141  Potassium     3.5 - 5.2 mmol/L 4.8  Chloride     96 - 106 mmol/L 100  CO2     20 - 29 mmol/L 26  Calcium     8.7 - 10.3 mg/dL 9.5  Total Protein     6.0 - 8.5 g/dL 6.6  Albumin  3.6 - 4.8 g/dL 4.2  Globulin, Total     1.5 - 4.5 g/dL 2.4  Albumin/Globulin Ratio     1.2 - 2.2 1.8  Total Bilirubin     0.0 - 1.2 mg/dL 0.3  Alkaline Phosphatase     39 - 117 IU/L 94  AST     0 - 40 IU/L 32  ALT     0 - 32 IU/L 37 (H)  Beef (Bos spp) IgE     <0.35 kU/L <0.10  Class Interpretation      0  Lamb/Mutton (Ovis spp) IgE     <0.35 kU/L <0.10  Class Interpretation      0  Pork (Sus spp) IgE     <0.35 kU/L <0.10   Class Interpretation      0  Alpha Gal IgE*     <0.10 kU/L <0.10  Thyroperoxidase Ab SerPl-aCnc     0 - 34 IU/mL <6  Thyroglobulin Antibody     0.0 - 0.9 IU/mL <1.0  cu index     <10 22.9 (H)  Tryptase     2.2 - 13.2 ug/L 5.4  Sed Rate     0 - 40 mm/hr 17    Medication List:  Current Outpatient Medications  Medication Sig Dispense Refill  . Alogliptin-metFORMIN HCl (KAZANO) 12.03-999 MG TABS Take by mouth 2 (two) times daily.    Marland Kitchen aspirin 81 MG tablet Take 81 mg by mouth daily.    . cetirizine (ZYRTEC) 10 MG tablet Take 1 tablet (10 mg total) by mouth 2 (two) times daily. 60 tablet 5  . Cholecalciferol (VITAMIN D-3) 1000 UNITS CAPS Take by mouth. Take 3 tablet bid    . diphenhydrAMINE (BENADRYL) 25 MG tablet Take 25 mg by mouth every 6 (six) hours as needed.    . famotidine (PEPCID) 20 MG tablet TAKE ONE TABLET BY MOUTH TWICE A DAY 60 tablet 0  . glucose blood (ONE TOUCH ULTRA TEST) test strip by Misc.(Non-Drug; Combo Route) route.    . hydrOXYzine (ATARAX/VISTARIL) 25 MG tablet hydroxyzine HCl 25 mg tablet    . Insulin Glargine (BASAGLAR KWIKPEN) 100 UNIT/ML SOPN Basaglar KwikPen U-100 Insulin 100 unit/mL (3 mL) subcutaneous    . LEVEMIR FLEXTOUCH 100 UNIT/ML Pen     . Magnesium 250 MG TABS Take by mouth.    . montelukast (SINGULAIR) 10 MG tablet TAKE ONE TABLET BY MOUTH AT BEDTIME 30 tablet 0  . Omega-3 1000 MG CAPS Take by mouth.    . vitamin B-12 (CYANOCOBALAMIN) 1000 MCG tablet Take 1,000 mcg by mouth daily.     No current facility-administered medications for this visit.    Allergies: Allergies  Allergen Reactions  . Canagliflozin Anxiety and Palpitations    Infections   I reviewed her past medical history, social history, family history, and environmental history and no significant changes have been reported from previous visit on 08/15/2018.  Review of Systems  Constitutional: Negative for chills and fever.  HENT: Negative for congestion, ear discharge, facial  swelling, postnasal drip, rhinorrhea and sneezing.   Eyes: Negative for discharge, redness and itching.  Respiratory: Negative for cough, chest tightness, shortness of breath and wheezing.   Cardiovascular: Negative.   Gastrointestinal: Negative.   Musculoskeletal: Negative for myalgias.  Skin: Negative for rash.  Neurological: Negative for headaches.   Objective: Physical Exam Not obtained as encounter was done via telephone.   Previous notes and tests were reviewed.  I discussed the assessment and treatment plan  with the patient. The patient was provided an opportunity to ask questions and all were answered. The patient agreed with the plan and demonstrated an understanding of the instructions.   The patient was advised to call back or seek an in-person evaluation if the symptoms worsen or if the condition fails to improve as anticipated.  I provided 18 minutes of non-face-to-face time during this encounter.  It was my pleasure to participate in Barbara [name]'s care today. Please feel free to contact me with any questions or concerns.   Sincerely,   Charmian Muff, MD

## 2019-06-09 DIAGNOSIS — N3281 Overactive bladder: Secondary | ICD-10-CM | POA: Diagnosis not present

## 2019-06-09 DIAGNOSIS — R42 Dizziness and giddiness: Secondary | ICD-10-CM | POA: Diagnosis not present

## 2019-06-09 DIAGNOSIS — E119 Type 2 diabetes mellitus without complications: Secondary | ICD-10-CM | POA: Diagnosis not present

## 2019-06-09 DIAGNOSIS — E538 Deficiency of other specified B group vitamins: Secondary | ICD-10-CM | POA: Diagnosis not present

## 2019-06-12 DIAGNOSIS — E538 Deficiency of other specified B group vitamins: Secondary | ICD-10-CM | POA: Diagnosis not present

## 2019-06-12 DIAGNOSIS — R42 Dizziness and giddiness: Secondary | ICD-10-CM | POA: Diagnosis not present

## 2019-06-12 DIAGNOSIS — E119 Type 2 diabetes mellitus without complications: Secondary | ICD-10-CM | POA: Diagnosis not present

## 2019-07-20 DIAGNOSIS — E669 Obesity, unspecified: Secondary | ICD-10-CM | POA: Diagnosis not present

## 2019-07-20 DIAGNOSIS — L508 Other urticaria: Secondary | ICD-10-CM | POA: Diagnosis not present

## 2019-07-20 DIAGNOSIS — Z5181 Encounter for therapeutic drug level monitoring: Secondary | ICD-10-CM | POA: Diagnosis not present

## 2019-07-20 DIAGNOSIS — R6889 Other general symptoms and signs: Secondary | ICD-10-CM | POA: Diagnosis not present

## 2019-07-20 DIAGNOSIS — Z794 Long term (current) use of insulin: Secondary | ICD-10-CM | POA: Diagnosis not present

## 2019-07-20 DIAGNOSIS — E119 Type 2 diabetes mellitus without complications: Secondary | ICD-10-CM | POA: Diagnosis not present

## 2019-07-21 DIAGNOSIS — Z23 Encounter for immunization: Secondary | ICD-10-CM | POA: Diagnosis not present

## 2019-09-02 DIAGNOSIS — E559 Vitamin D deficiency, unspecified: Secondary | ICD-10-CM | POA: Diagnosis not present

## 2019-09-02 DIAGNOSIS — E782 Mixed hyperlipidemia: Secondary | ICD-10-CM | POA: Diagnosis not present

## 2019-09-02 DIAGNOSIS — K219 Gastro-esophageal reflux disease without esophagitis: Secondary | ICD-10-CM | POA: Diagnosis not present

## 2019-09-02 DIAGNOSIS — E119 Type 2 diabetes mellitus without complications: Secondary | ICD-10-CM | POA: Diagnosis not present

## 2019-09-02 DIAGNOSIS — E669 Obesity, unspecified: Secondary | ICD-10-CM | POA: Diagnosis not present

## 2019-09-02 DIAGNOSIS — Z20828 Contact with and (suspected) exposure to other viral communicable diseases: Secondary | ICD-10-CM | POA: Diagnosis not present

## 2019-09-02 DIAGNOSIS — J329 Chronic sinusitis, unspecified: Secondary | ICD-10-CM | POA: Diagnosis not present

## 2019-09-03 DIAGNOSIS — E559 Vitamin D deficiency, unspecified: Secondary | ICD-10-CM | POA: Diagnosis not present

## 2019-09-03 DIAGNOSIS — K219 Gastro-esophageal reflux disease without esophagitis: Secondary | ICD-10-CM | POA: Diagnosis not present

## 2019-09-03 DIAGNOSIS — E782 Mixed hyperlipidemia: Secondary | ICD-10-CM | POA: Diagnosis not present

## 2019-09-03 DIAGNOSIS — E119 Type 2 diabetes mellitus without complications: Secondary | ICD-10-CM | POA: Diagnosis not present

## 2019-09-03 DIAGNOSIS — Z20828 Contact with and (suspected) exposure to other viral communicable diseases: Secondary | ICD-10-CM | POA: Diagnosis not present

## 2019-09-08 DIAGNOSIS — H900 Conductive hearing loss, bilateral: Secondary | ICD-10-CM | POA: Diagnosis not present

## 2019-09-08 DIAGNOSIS — H6523 Chronic serous otitis media, bilateral: Secondary | ICD-10-CM | POA: Diagnosis not present

## 2019-09-08 DIAGNOSIS — H903 Sensorineural hearing loss, bilateral: Secondary | ICD-10-CM | POA: Diagnosis not present

## 2019-09-08 DIAGNOSIS — J329 Chronic sinusitis, unspecified: Secondary | ICD-10-CM | POA: Diagnosis not present

## 2019-09-10 DIAGNOSIS — E119 Type 2 diabetes mellitus without complications: Secondary | ICD-10-CM | POA: Diagnosis not present

## 2019-09-10 DIAGNOSIS — E669 Obesity, unspecified: Secondary | ICD-10-CM | POA: Diagnosis not present

## 2019-09-10 DIAGNOSIS — Z79899 Other long term (current) drug therapy: Secondary | ICD-10-CM | POA: Diagnosis not present

## 2019-09-10 DIAGNOSIS — H9190 Unspecified hearing loss, unspecified ear: Secondary | ICD-10-CM | POA: Diagnosis not present

## 2019-09-10 DIAGNOSIS — Z794 Long term (current) use of insulin: Secondary | ICD-10-CM | POA: Diagnosis not present

## 2019-10-12 ENCOUNTER — Other Ambulatory Visit: Payer: Self-pay | Admitting: Allergy

## 2019-10-16 ENCOUNTER — Ambulatory Visit (INDEPENDENT_AMBULATORY_CARE_PROVIDER_SITE_OTHER): Payer: Medicare Other | Admitting: Allergy

## 2019-10-16 ENCOUNTER — Encounter: Payer: Self-pay | Admitting: Allergy

## 2019-10-16 ENCOUNTER — Other Ambulatory Visit: Payer: Self-pay

## 2019-10-16 VITALS — BP 120/70 | HR 80 | Temp 96.6°F | Resp 16 | Ht 64.5 in | Wt 211.2 lb

## 2019-10-16 DIAGNOSIS — L508 Other urticaria: Secondary | ICD-10-CM

## 2019-10-16 NOTE — Progress Notes (Signed)
Follow-up Note  RE: Barbara [name] MRN: [id number] DOB: [birth year] Date of Office Visit: 10/16/2019   History of present illness: Barbara [name] is a 68 y.o. female presenting today for follow-up of chronic urticaria.  She was last seen in the office on 04/16/2019 by myself.  She states in September she was treated for flu with tamiflu.  She state she had a virtual visit for the symptoms she was having and diagnosed with influenza.  She states symptoms resolved with use of tamiflu.   During this time her hives did flare.  This is the only time since last visit that her hives flared.   She is currently taking zyrtec twice a day and singulair daily.  Not taking any pepcid at this time.   She denies any health changes, surgeries or hospitalizations.   Review of systems: Review of Systems  Constitutional: Negative for chills, fever and malaise/fatigue.  HENT: Negative.   Eyes: Negative.   Respiratory: Negative.   Gastrointestinal: Negative.   Musculoskeletal: Negative.   Skin: Negative for itching and rash.  Neurological: Negative.     All other systems negative unless noted above in HPI  Past medical/social/surgical/family history have been reviewed and are unchanged unless specifically indicated below.  No changes  Medication List: Current Outpatient Medications  Medication Sig Dispense Refill  . aspirin 81 MG tablet Take 81 mg by mouth daily.    . cetirizine (ZYRTEC) 10 MG tablet Take 1 tablet (10 mg total) by mouth 2 (two) times daily. 60 tablet 5  . Cholecalciferol (VITAMIN D-3) 1000 UNITS CAPS Take by mouth. Take 3 tablet bid    . diphenhydrAMINE (BENADRYL) 25 MG tablet Take 25 mg by mouth every 6 (six) hours as needed.    Marland Kitchen glucose blood (ONE TOUCH ULTRA TEST) test strip by Misc.(Non-Drug; Combo Route) route.    . hydrOXYzine (ATARAX/VISTARIL) 25 MG tablet hydroxyzine HCl 25 mg tablet    . Insulin Glargine (BASAGLAR KWIKPEN) 100 UNIT/ML SOPN Basaglar KwikPen U-100 Insulin  100 unit/mL (3 mL) subcutaneous    . LEVEMIR FLEXTOUCH 100 UNIT/ML Pen     . Magnesium 250 MG TABS Take by mouth.    . montelukast (SINGULAIR) 10 MG tablet TAKE ONE TABLET BY MOUTH AT BEDTIME 30 tablet 0  . Omega-3 1000 MG CAPS Take by mouth.    . vitamin B-12 (CYANOCOBALAMIN) 1000 MCG tablet Take 1,000 mcg by mouth daily.    . famotidine (PEPCID) 20 MG tablet TAKE ONE TABLET BY MOUTH TWICE A DAY (Patient not taking: Reported on 10/16/2019) 60 tablet 0   No current facility-administered medications for this visit.      Known medication allergies: Allergies  Allergen Reactions  . Canagliflozin Anxiety and Palpitations    Infections     Physical examination: Blood pressure 120/70, pulse 80, temperature (!) 96.6 F (35.9 C), temperature source Temporal, resp. rate 16, height 5' 4.5" (1.638 m), weight 211 lb 3.2 oz (95.8 kg), SpO2 97 %.  General: Alert, interactive, in no acute distress. Lungs: Clear to auscultation without wheezing, rhonchi or rales. {no increased work of breathing. CV: Normal S1, S2 without murmurs. Abdomen: Nondistended, nontender. Skin: Warm and dry, without lesions or rashes. Extremities:  No clubbing, cyanosis or edema. Neuro:   Grossly intact.  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  This leads to the development of chronic and recurrent hives and/or swelling. Lab work also revealed a very low level IgE to ragweed pollen.   -Hives have been controlled with the high-dose antihistamine regimen.     -Continue the following medication regimen: decrease to  Zyrtec 10mg  1 tablet once a day and Singulair 10mg  1 tablet daily.   If hives or itch arise then go back to Zyrtec twice a day.   If this doesn't help improve hives or itch then add Pepcid 20mg   1 tablet twice a day to your regimen   - we have discussed the option of Xolair monthly injections as an added therapy in control of hives if above regimen is not effective enough.    Follow-up 6 -24months or sooner if needed  I appreciate the opportunity to take part in Kecia's care. Please do not hesitate to contact me with questions.  Sincerely,   Prudy Feeler, MD Allergy/Immunology [facility] [place]

## 2019-10-16 NOTE — Patient Instructions (Addendum)
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  This leads to the development of chronic and recurrent hives and/or swelling. Lab work also revealed a very low level IgE to ragweed pollen.   -Hives have been controlled with the high-dose antihistamine regimen.     -Continue the following medication regimen: decrease to  Zyrtec 10mg  1 tablet once a day and Singulair 10mg  1 tablet daily.   If hives or itch arise then go back to Zyrtec twice a day.   If this doesn't help improve hives or itch then add Pepcid 20mg  1 tablet twice a day to your regimen   - we have discussed the option of Xolair monthly injections as an added therapy in control of hives if above regimen is not effective enough.    Follow-up 6 -71months or sooner if needed

## 2019-10-29 DIAGNOSIS — Z6837 Body mass index (BMI) 37.0-37.9, adult: Secondary | ICD-10-CM | POA: Diagnosis not present

## 2019-10-29 DIAGNOSIS — Z1231 Encounter for screening mammogram for malignant neoplasm of breast: Secondary | ICD-10-CM | POA: Diagnosis not present

## 2019-10-29 DIAGNOSIS — Z01419 Encounter for gynecological examination (general) (routine) without abnormal findings: Secondary | ICD-10-CM | POA: Diagnosis not present

## 2019-11-04 ENCOUNTER — Encounter: Payer: Self-pay | Admitting: Gastroenterology

## 2019-11-10 ENCOUNTER — Other Ambulatory Visit: Payer: Self-pay | Admitting: Allergy

## 2019-11-23 ENCOUNTER — Other Ambulatory Visit: Payer: Self-pay

## 2019-11-23 ENCOUNTER — Ambulatory Visit (AMBULATORY_SURGERY_CENTER): Payer: Self-pay | Admitting: *Deleted

## 2019-11-23 VITALS — Temp 95.9°F | Ht 64.5 in | Wt 215.0 lb

## 2019-11-23 DIAGNOSIS — Z86010 Personal history of colonic polyps: Secondary | ICD-10-CM

## 2019-11-23 DIAGNOSIS — Z800 Family history of malignant neoplasm of digestive organs: Secondary | ICD-10-CM

## 2019-11-23 DIAGNOSIS — Z1159 Encounter for screening for other viral diseases: Secondary | ICD-10-CM

## 2019-11-23 NOTE — Addendum Note (Signed)
Addended by: Laverna Peace on: 11/23/2019 03:24 PM   Modules accepted: Orders

## 2019-11-23 NOTE — Progress Notes (Signed)
Pt is aware that care partner will wait in the car during procedure; if they feel like they will be too hot or cold to wait in the car; they may wait in the 4 th floor lobby. Patient is aware to bring only one care partner. We want them to wear a mask (we do not have any that we can provide them), practice social distancing, and we will check their temperatures when they get here.  I did remind the patient that their care partner needs to stay in the parking lot the entire time and have a cell phone available, we will call them when the pt is ready for discharge. Patient will wear mask into building.  covid test 12-02-19 at 940  No egg or soy allergy  No home oxygen use or problems with anesthesia  No medications for weight loss taken  emmi information given

## 2019-12-02 ENCOUNTER — Other Ambulatory Visit: Payer: Self-pay | Admitting: Gastroenterology

## 2019-12-02 ENCOUNTER — Ambulatory Visit (INDEPENDENT_AMBULATORY_CARE_PROVIDER_SITE_OTHER): Payer: Medicare Other

## 2019-12-02 DIAGNOSIS — Z1159 Encounter for screening for other viral diseases: Secondary | ICD-10-CM

## 2019-12-02 LAB — SARS CORONAVIRUS 2 (TAT 6-24 HRS): SARS Coronavirus 2: NEGATIVE

## 2019-12-07 ENCOUNTER — Encounter: Payer: Self-pay | Admitting: Gastroenterology

## 2019-12-07 ENCOUNTER — Ambulatory Visit (AMBULATORY_SURGERY_CENTER): Payer: Medicare Other | Admitting: Gastroenterology

## 2019-12-07 ENCOUNTER — Other Ambulatory Visit: Payer: Self-pay

## 2019-12-07 VITALS — BP 121/71 | HR 59 | Temp 98.4°F | Resp 21 | Ht 64.0 in | Wt 215.0 lb

## 2019-12-07 DIAGNOSIS — D123 Benign neoplasm of transverse colon: Secondary | ICD-10-CM | POA: Diagnosis not present

## 2019-12-07 DIAGNOSIS — Z86010 Personal history of colonic polyps: Secondary | ICD-10-CM | POA: Diagnosis not present

## 2019-12-07 MED ORDER — SODIUM CHLORIDE 0.9 % IV SOLN: 500.0000 mL | Freq: Once | INTRAVENOUS | Status: DC

## 2019-12-07 NOTE — Op Note (Signed)
[facility] Patient Name: [place] Delancey Procedure Date: 12/07/2019 7:58 AM MRN: [id number] Endoscopist: Milus Banister , MD Age: 69 Referring MD:  Date of Birth: [birth year] Gender: Female Account #: [id number] Procedure:                Colonoscopy Indications:              High risk colon cancer surveillance: Personal                            history of colonic polyps; FH of colon cancer                            (brother and sister), also 2012 Colonoscopy with                            1.7cm TVA, removed piecemeal; repeat colonoscopy                            04/2013 colonoscopy and SSA removed 1cm, piecemeal,                            site was tatoo'd. Colonoscopy 2015 no new polyps. Medicines:                Monitored Anesthesia Care Procedure:                Pre-Anesthesia Assessment:                           - Prior to the procedure, a History and Physical                            was performed, and patient medications and                            allergies were reviewed. The patient's tolerance of                            previous anesthesia was also reviewed. The risks                            and benefits of the procedure and the sedation                            options and risks were discussed with the patient.                            All questions were answered, and informed consent                            was obtained. Prior Anticoagulants: The patient has                            taken no previous anticoagulant or antiplatelet  agents. ASA Grade Assessment: II - A patient with                            mild systemic disease. After reviewing the risks                            and benefits, the patient was deemed in                            satisfactory condition to undergo the procedure.                           After obtaining informed consent, the colonoscope                            was passed under  direct vision. Throughout the                            procedure, the patient's blood pressure, pulse, and                            oxygen saturations were monitored continuously. The                            Colonoscope was introduced through the anus and                            advanced to the the cecum, identified by                            appendiceal orifice and ileocecal valve. The                            colonoscopy was performed without difficulty. The                            patient tolerated the procedure well. The quality                            of the bowel preparation was good. The ileocecal                            valve, appendiceal orifice, and rectum were                            photographed. Scope In: 8:05:47 AM Scope Out: 8:17:01 AM Scope Withdrawal Time: 0 hours 7 minutes 40 seconds  Total Procedure Duration: 0 hours 11 minutes 14 seconds  Findings:                 The site of previous transverse colon polypectomy                            and tattoo was normal appearing.  A 3 mm polyp was found in the transverse colon. The                            polyp was sessile. The polyp was removed with a                            cold snare. Resection and retrieval were complete.                           The exam was otherwise without abnormality on                            direct and retroflexion views. Complications:            No immediate complications. Estimated blood loss:                            None. Estimated Blood Loss:     Estimated blood loss: none. Impression:               - One 3 mm polyp in the transverse colon, removed                            with a cold snare. Resected and retrieved.                           - The site of previous transverse colon polypectomy                            and tattoo was normal appearing.                           - The examination was otherwise normal on direct                             and retroflexion views. Recommendation:           - Patient has a contact number available for                            emergencies. The signs and symptoms of potential                            delayed complications were discussed with the                            patient. Return to normal activities tomorrow.                            Written discharge instructions were provided to the                            patient.                           -  Resume previous diet.                           - Continue present medications.                           - Await pathology results. Milus Banister, MD 12/07/2019 8:25:18 AM This report has been signed electronically.

## 2019-12-07 NOTE — Progress Notes (Signed)
Pt's states no medical or surgical changes since previsit or office visit.  HC IV, Jb temps and CW vitals.

## 2019-12-07 NOTE — Progress Notes (Signed)
Report given to PACU, vss 

## 2019-12-07 NOTE — Progress Notes (Signed)
Called to room to assist during endoscopic procedure.  Patient ID and intended procedure confirmed with present staff. Received instructions for my participation in the procedure from the performing physician.  

## 2019-12-07 NOTE — Patient Instructions (Signed)
Please, read all of the handouts given to you by your recovery room nurse.  Thank-you for choosing [place] for your healthcare needs today.  YOU HAD AN ENDOSCOPIC PROCEDURE TODAY AT [facility]:   Refer to the procedure report that was given to you for any specific questions about what was found during the examination.  If the procedure report does not answer your questions, please call your gastroenterologist to clarify.  If you requested that your care partner not be given the details of your procedure findings, then the procedure report has been included in a sealed envelope for you to review at your convenience later.  YOU SHOULD EXPECT: Some feelings of bloating in the abdomen. Passage of more gas than usual.  Walking can help get rid of the air that was put into your GI tract during the procedure and reduce the bloating. If you had a lower endoscopy (such as a colonoscopy or flexible sigmoidoscopy) you may notice spotting of blood in your stool or on the toilet paper. If you underwent a bowel prep for your procedure, you may not have a normal bowel movement for a few days.  Please Note:  You might notice some irritation and congestion in your nose or some drainage.  This is from the oxygen used during your procedure.  There is no need for concern and it should clear up in a day or so.  SYMPTOMS TO REPORT IMMEDIATELY:   Following lower endoscopy (colonoscopy or flexible sigmoidoscopy):  Excessive amounts of blood in the stool  Significant tenderness or worsening of abdominal pains  Swelling of the abdomen that is new, acute  Fever of 100F or higher   For urgent or emergent issues, a gastroenterologist can be reached at any hour by calling [phone].   DIET:  We do recommend a small meal at first, but then you may proceed to your regular diet.  Drink plenty of fluids but you should avoid alcoholic beverages for 24 hours.  ACTIVITY:  You should plan to take it easy for the  rest of today and you should NOT DRIVE or use heavy machinery until tomorrow (because of the sedation medicines used during the test).    FOLLOW UP: Our staff will call the number listed on your records 48-72 hours following your procedure to check on you and address any questions or concerns that you may have regarding the information given to you following your procedure. If we do not reach you, we will leave a message.  We will attempt to reach you two times.  During this call, we will ask if you have developed any symptoms of COVID 19. If you develop any symptoms (ie: fever, flu-like symptoms, shortness of breath, cough etc.) before then, please call [phone].  If you test positive for Covid 19 in the 2 weeks post procedure, please call and report this information to [place].    If any biopsies were taken you will be contacted by phone or by letter within the next 1-3 weeks.  Please call us at [phone] if you have not heard about the biopsies in 3 weeks.    SIGNATURES/CONFIDENTIALITY: You and/or your care partner have signed paperwork which will be entered into your electronic medical record.  These signatures attest to the fact that that the information above on your After Visit Summary has been reviewed and is understood.  Full responsibility of the confidentiality of this discharge information lies with you and/or your care-partner.

## 2019-12-09 ENCOUNTER — Telehealth: Payer: Self-pay

## 2019-12-09 NOTE — Telephone Encounter (Signed)
  Follow up Call-  Call back number 12/07/2019  Post procedure Call Back phone  # [phone]  Permission to leave phone message Yes  Some recent data might be hidden     Patient questions:  Do you have a fever, pain , or abdominal swelling? No. Pain Score  0 *  Have you tolerated food without any problems? Yes.    Have you been able to return to your normal activities? Yes.    Do you have any questions about your discharge instructions: Diet   No. Medications  No. Follow up visit  No.  Do you have questions or concerns about your Care? No.  Actions: * If pain score is 4 or above: No action needed, pain <4.  1. Have you developed a fever since your procedure? no  2.   Have you had an respiratory symptoms (SOB or cough) since your procedure? no  3.   Have you tested positive for COVID 19 since your procedure no  4.   Have you had any family members/close contacts diagnosed with the COVID 19 since your procedure?  no   If yes to any of these questions please route to Joylene John, RN and Alphonsa Gin, Therapist, sports.

## 2019-12-09 NOTE — Telephone Encounter (Signed)
1st follow up all made.  NALM

## 2019-12-10 ENCOUNTER — Encounter: Payer: Self-pay | Admitting: Gastroenterology

## 2020-05-04 ENCOUNTER — Encounter: Payer: Self-pay | Admitting: Family Medicine

## 2020-05-04 ENCOUNTER — Other Ambulatory Visit: Payer: Self-pay

## 2020-05-04 ENCOUNTER — Ambulatory Visit (INDEPENDENT_AMBULATORY_CARE_PROVIDER_SITE_OTHER): Payer: Medicare Other | Admitting: Family Medicine

## 2020-05-04 VITALS — BP 100/60 | HR 80 | Wt 221.0 lb

## 2020-05-04 DIAGNOSIS — E119 Type 2 diabetes mellitus without complications: Secondary | ICD-10-CM | POA: Diagnosis present

## 2020-05-04 DIAGNOSIS — Z114 Encounter for screening for human immunodeficiency virus [HIV]: Secondary | ICD-10-CM

## 2020-05-04 LAB — POCT GLYCOSYLATED HEMOGLOBIN (HGB A1C): HbA1c, POC (controlled diabetic range): 9.1 % — AB (ref 0.0–7.0)

## 2020-05-04 NOTE — Progress Notes (Signed)
° ° °  SUBJECTIVE:   CHIEF COMPLAINT / HPI:   Barbara [name] is a 69 yr old female who presents today for new patient visit.  Patient would like to discuss her diabetes today.  T2DM Last A1c 8.1 in 2019. Is known to Dr. Buddy Duty at Endocrine.  Currently takes Humalog 30 units twice daily and Metformin 500 mg once daily.  She has previously tried Metformin at higher doses and Invokana however had intolerance (diarrhea) to them.  Fasting blood sugars at home are generally over 200.  Denies blurred vision, polyuria or polydipsia.  She knows that her blood sugars are higher than normal and would like tighter control.  Also would like to come off her insulin. Patient has a active lifestyle and walks 6 miles a day and does aerobic exercise 3 days a week.  She does not drink excessive sugary drinks or candy.  Has a diet consisting of salads, fruits vegetables sometimes bacon.  PMH: T2DM, Bronchitis, colon polyps  PSH: Right Fallopian tube removal secondary to cyst  DH: Allergic to Canagliflozin.  Aspirin, vitamin D Humalog 30 units twice daily, magnesium, montelukast, Metformin  FH: Strong positive family history for colon cancer.  Brother died at age 76 from colon cancer sister died at age 49. Heart disease and lung cancer.  SH: Lives with daughter and granddaughter.  Denies smoking or illicit drug use.  Drinks alcohol occasionally on social occasions.    OBJECTIVE:   BP 100/60    Pulse 80    Wt 221 lb (100.2 kg)    SpO2 97%    BMI 37.93 kg/m   General: Alert, pleasant, appears stated age, no distress Cardio: Normal S1 and S2, RRR Pulm: CTAB, normal work of breathing Abdomen: Bowel sounds normal. Abdomen soft and non-tender.  Extremities: No peripheral edema. Warm/ well perfused.  Strong radial pulse Neuro: Cranial nerves grossly intact  ASSESSMENT/PLAN:   Type II or unspecified type diabetes mellitus without mention of complication, not stated as uncontrolled A1c today 9.1 indicating poor  control.  Previous A1c on file 8.1 which was 8 years ago.  I presume patient has had A1c at endocrine clinic but unable to see these records.  Recommended that patient increase her Humalog to 33 units twice daily and continue Metformin at 500 mg daily due to intolerance to higher doses.  Recommended that if patient's CBGs persistently remain above 200 with the increased Humalog to contact me and we can titrate the Humalog higher.  Follow-up with me for diabetic check in 3 months. Aim will be to start patient on SGLT2 inhibitor or GLP-1 agonist and aim to wean patient off Humalog.  Congratulated patient on healthy lifestyle choices to keep up her exercise.  Obesity Spoke about patient's high BMI and the risks of obesity.  Provided diet and exercise counseling.  Patient already leads an active lifestyle so she should work on her diet to lose weight.  Provided diabetic diet handout to her.  Follow-up the patient booked for 1 month's time to discuss weight loss.     Lattie Haw, MD [place]

## 2020-05-04 NOTE — Patient Instructions (Addendum)
Lovely to meet you today!! Your diabetes marker was higher today so we are going to keep the Metformin the same and increase the Insulin to 33 units twice a day.  Lets see how you do on this. I will see you in 1 month for a follow up and see how you are doing. Please see the diabetic diet below and try and implement that as much as possible.    Diet Recommendations for Diabetes   Starchy (carb) foods: Bread, rice, pasta, potatoes, corn, cereal, grits, crackers, bagels, muffins, all baked goods.  (Fruits, milk, and yogurt also have carbohydrate, but most of these foods will not spike your blood sugar as most starchy foods will.)  A few fruits do cause high blood sugars; use small portions of bananas (limit to 1/2 at a time), grapes, watermelon, oranges, and most tropical fruits.    Protein foods: Meat, fish, poultry, eggs, dairy foods, and beans such as pinto and kidney beans (beans also provide carbohydrate).   1. Eat at least 3 meals and 1-2 snacks per day. Never go more than 4-5 hours while awake without eating. Eat breakfast within the first hour of getting up.   2. Limit starchy foods to TWO per meal and ONE per snack. ONE portion of a starchy  food is equal to the following:   - ONE slice of bread (or its equivalent, such as half of a hamburger bun).   - 1/2 cup of a "scoopable" starchy food such as potatoes or rice.   - 15 grams of Total Carbohydrate as shown on food label.  3. Include at every meal: a protein food, a carb food, and vegetables and/or fruit.   - Obtain twice the volume of veg's as protein or carbohydrate foods for both lunch and dinner.   - Fresh or frozen veg's are best.   - Keep frozen veg's on hand for a quick vegetable serving.

## 2020-05-05 NOTE — Assessment & Plan Note (Signed)
Spoke about patient's high BMI and the risks of obesity.  Provided diet and exercise counseling.  Patient already leads an active lifestyle so she should work on her diet to lose weight.  Provided diabetic diet handout to her.  Follow-up the patient booked for 1 month's time to discuss weight loss.

## 2020-05-05 NOTE — Assessment & Plan Note (Signed)
A1c today 9.1 indicating poor control.  Previous A1c on file 8.1 which was 8 years ago.  I presume patient has had A1c at endocrine clinic but unable to see these records.  Recommended that patient increase her Humalog to 33 units twice daily and continue Metformin at 500 mg daily due to intolerance to higher doses.  Recommended that if patient's CBGs persistently remain above 200 with the increased Humalog to contact me and we can titrate the Humalog higher.  Follow-up with me for diabetic check in 3 months. Aim will be to start patient on SGLT2 inhibitor or GLP-1 agonist and aim to wean patient off Humalog.  Congratulated patient on healthy lifestyle choices to keep up her exercise.

## 2020-06-03 ENCOUNTER — Ambulatory Visit (INDEPENDENT_AMBULATORY_CARE_PROVIDER_SITE_OTHER): Payer: Medicare Other | Admitting: Family Medicine

## 2020-06-03 ENCOUNTER — Encounter: Payer: Self-pay | Admitting: Family Medicine

## 2020-06-03 ENCOUNTER — Other Ambulatory Visit: Payer: Self-pay

## 2020-06-03 VITALS — BP 106/60 | HR 66 | Ht 64.0 in | Wt 221.0 lb

## 2020-06-03 DIAGNOSIS — E119 Type 2 diabetes mellitus without complications: Secondary | ICD-10-CM

## 2020-06-03 NOTE — Patient Instructions (Signed)
Lovely to see you today. Please increase your humalog to 36 units twice daily. I am checking your kidney and liver function today before we start a medication called trulicity. This works by increasing the insulin in your body. We will keep the metformin dose the same.  I will call you next week with the lab results and we can discuss starting you on trulicity.  Best wishes,  Dr Posey Pronto   Dulaglutide injection What is this medicine? DULAGLUTIDE (DOO la GLOO tide) is used to improve blood sugar control in adults with type 2 diabetes. This medicine may be used with other oral diabetes medicines. This drug may also reduce the risk of heart attack or stroke if you have type 2 diabetes and risk factors for heart disease. This medicine may be used for other purposes; ask your health care provider or pharmacist if you have questions. COMMON BRAND NAME(S): Trulicity What should I tell my health care provider before I take this medicine? They need to know if you have any of these conditions:  endocrine tumors (MEN 2) or if someone in your family had these tumors  eye disease, vision problems  history of pancreatitis  kidney disease  liver disease  stomach problems  thyroid cancer or if someone in your family had thyroid cancer  an unusual or allergic reaction to dulaglutide, other medicines, foods, dyes, or preservatives  pregnant or trying to get pregnant  breast-feeding How should I use this medicine? This medicine is for injection under the skin of your upper leg (thigh), stomach area, or upper arm. It is usually given once every week (every 7 days). You will be taught how to prepare and give this medicine. Use exactly as directed. Take your medicine at regular intervals. Do not take it more often than directed. If you use this medicine with insulin, you should inject this medicine and the insulin separately. Do not mix them together. Do not give the injections right next to each other.  Change (rotate) injection sites with each injection. It is important that you put your used needles and syringes in a special sharps container. Do not put them in a trash can. If you do not have a sharps container, call your pharmacist or healthcare provider to get one. A special MedGuide will be given to you by the pharmacist with each prescription and refill. Be sure to read this information carefully each time. This drug comes with INSTRUCTIONS FOR USE. Ask your pharmacist for directions on how to use this drug. Read the information carefully. Talk to your pharmacist or health care provider if you have questions. Talk to your pediatrician regarding the use of this medicine in children. Special care may be needed. Overdosage: If you think you have taken too much of this medicine contact a poison control center or emergency room at once. NOTE: This medicine is only for you. Do not share this medicine with others. What if I miss a dose? If you miss a dose, take it as soon as you can within 3 days after the missed dose. Then take your next dose at your regular weekly time. If it has been longer than 3 days after the missed dose, do not take the missed dose. Take the next dose at your regular time. Do not take double or extra doses. If you have questions about a missed dose, contact your health care provider for advice. What may interact with this medicine?  other medicines for diabetes Many medications may cause changes  in blood sugar, these include:  alcohol containing beverages  antiviral medicines for HIV or AIDS  aspirin and aspirin-like drugs  certain medicines for blood pressure, heart disease, irregular heart beat  chromium  diuretics  female hormones, such as estrogens or progestins, birth control pills  fenofibrate  gemfibrozil  isoniazid  lanreotide  female hormones or anabolic steroids  MAOIs like Carbex, Eldepryl, Marplan, Nardil, and Parnate  medicines for weight  loss  medicines for allergies, asthma, cold, or cough  medicines for depression, anxiety, or psychotic disturbances  niacin  nicotine  NSAIDs, medicines for pain and inflammation, like ibuprofen or naproxen  octreotide  pasireotide  pentamidine  phenytoin  probenecid  quinolone antibiotics such as ciprofloxacin, levofloxacin, ofloxacin  some herbal dietary supplements  steroid medicines such as prednisone or cortisone  sulfamethoxazole; trimethoprim  thyroid hormones Some medications can hide the warning symptoms of low blood sugar (hypoglycemia). You may need to monitor your blood sugar more closely if you are taking one of these medications. These include:  beta-blockers, often used for high blood pressure or heart problems (examples include atenolol, metoprolol, propranolol)  clonidine  guanethidine  reserpine This list may not describe all possible interactions. Give your health care provider a list of all the medicines, herbs, non-prescription drugs, or dietary supplements you use. Also tell them if you smoke, drink alcohol, or use illegal drugs. Some items may interact with your medicine. What should I watch for while using this medicine? Visit your doctor or health care professional for regular checks on your progress. Drink plenty of fluids while taking this medicine. Check with your doctor or health care professional if you get an attack of severe diarrhea, nausea, and vomiting. The loss of too much body fluid can make it dangerous for you to take this medicine. A test called the HbA1C (A1C) will be monitored. This is a simple blood test. It measures your blood sugar control over the last 2 to 3 months. You will receive this test every 3 to 6 months. Learn how to check your blood sugar. Learn the symptoms of low and high blood sugar and how to manage them. Always carry a quick-source of sugar with you in case you have symptoms of low blood sugar. Examples  include hard sugar candy or glucose tablets. Make sure others know that you can choke if you eat or drink when you develop serious symptoms of low blood sugar, such as seizures or unconsciousness. They must get medical help at once. Tell your doctor or health care professional if you have high blood sugar. You might need to change the dose of your medicine. If you are sick or exercising more than usual, you might need to change the dose of your medicine. Do not skip meals. Ask your doctor or health care professional if you should avoid alcohol. Many nonprescription cough and cold products contain sugar or alcohol. These can affect blood sugar. Pens should never be shared. Even if the needle is changed, sharing may result in passing of viruses like hepatitis or HIV. Wear a medical ID bracelet or chain, and carry a card that describes your disease and details of your medicine and dosage times. What side effects may I notice from receiving this medicine? Side effects that you should report to your doctor or health care professional as soon as possible:  allergic reactions like skin rash, itching or hives, swelling of the face, lips, or tongue  breathing problems  changes in vision  diarrhea that  continues or is severe  lump or swelling on the neck  severe nausea  signs and symptoms of infection like fever or chills; cough; sore throat; pain or trouble passing urine  signs and symptoms of low blood sugar such as feeling anxious, confusion, dizziness, increased hunger, unusually weak or tired, sweating, shakiness, cold, irritable, headache, blurred vision, fast heartbeat, loss of consciousness  signs and symptoms of kidney injury like trouble passing urine or change in the amount of urine  trouble swallowing  unusual stomach upset or pain  vomiting Side effects that usually do not require medical attention (report to your doctor or health care professional if they continue or are  bothersome):  diarrhea  loss of appetite  nausea  pain, redness, or irritation at site where injected  stomach upset This list may not describe all possible side effects. Call your doctor for medical advice about side effects. You may report side effects to FDA at 1-800-FDA-1088. Where should I keep my medicine? Keep out of the reach of children. Store unopened pens in a refrigerator between 2 and 8 degrees C (36 and 46 degrees F). Do not freeze or use if the medicine has been frozen. Protect from light and excessive heat. Store in the carton until use. Each single-dose pen can be kept at room temperature, not to exceed 30 degrees C (86 degrees F) for a total of 14 days, if needed. Throw away any unused medicine after the expiration date on the label. NOTE: This sheet is a summary. It may not cover all possible information. If you have questions about this medicine, talk to your doctor, pharmacist, or health care provider.  2020 Elsevier/Gold Standard (2019-07-28 09:34:53)

## 2020-06-04 LAB — COMPREHENSIVE METABOLIC PANEL
ALT: 20 IU/L (ref 0–32)
AST: 16 IU/L (ref 0–40)
Albumin/Globulin Ratio: 1.7 (ref 1.2–2.2)
Albumin: 4.1 g/dL (ref 3.8–4.8)
Alkaline Phosphatase: 113 IU/L (ref 48–121)
BUN/Creatinine Ratio: 18 (ref 12–28)
BUN: 12 mg/dL (ref 8–27)
Bilirubin Total: 0.3 mg/dL (ref 0.0–1.2)
CO2: 22 mmol/L (ref 20–29)
Calcium: 9.4 mg/dL (ref 8.7–10.3)
Chloride: 106 mmol/L (ref 96–106)
Creatinine, Ser: 0.66 mg/dL (ref 0.57–1.00)
GFR calc Af Amer: 104 mL/min/{1.73_m2} (ref >59)
GFR calc non Af Amer: 90 mL/min/{1.73_m2} (ref >59)
Globulin, Total: 2.4 g/dL (ref 1.5–4.5)
Glucose: 121 mg/dL — ABNORMAL HIGH (ref 65–99)
Potassium: 4.4 mmol/L (ref 3.5–5.2)
Sodium: 142 mmol/L (ref 134–144)
Total Protein: 6.5 g/dL (ref 6.0–8.5)

## 2020-06-04 LAB — LIPID PANEL
Chol/HDL Ratio: 2.9 ratio (ref 0.0–4.4)
Cholesterol, Total: 152 mg/dL (ref 100–199)
HDL: 52 mg/dL (ref >39)
LDL Chol Calc (NIH): 85 mg/dL (ref 0–99)
Triglycerides: 75 mg/dL (ref 0–149)
VLDL Cholesterol Cal: 15 mg/dL (ref 5–40)

## 2020-06-05 NOTE — Assessment & Plan Note (Signed)
Blood glucose still uncontrolled with Humalog and Metformin. Recommend increasing Humalog to 36 units BID. CMP and cholesterol panel obtained today. Discussed option of starting GLP1 agonist or SGLT2 inhibitor and its benefits. Patient would prefer medications which are infrequent injections compared to daily pills. We discussed starting Trulicity and its side effects. Provided patient a hand out on Trulicity and I will call patient with lab results and considering starting this if she feels comfortable. Pt happy with plan. Congratulated patient on her exercise and healthy lifestyle.

## 2020-06-05 NOTE — Progress Notes (Signed)
    SUBJECTIVE:   CHIEF COMPLAINT / HPI:   Barbara [name] is a 69 yr old female who presents today for diabetic follow up  Diabetes Since last follow up has increased Humalog to 33 units BID without side effects. CBGs are still 170s-250s. Continues on same dose of Metformin 500mg  once daily due to side effects on higher doses previously. Would like to go back to taking Lantus as her CBGs have been high. Continues to walk  6 miles a day with friends/family and does aerobic exercise 3 times a week. Follows a relatively healthy diet.  PERTINENT  PMH / PSH: Diabetes, morbid obesity   OBJECTIVE:   BP 106/60   Pulse 66   Ht 5\' 4"  (1.626 m)   Wt 221 lb (100.2 kg)   SpO2 98%   BMI 37.93 kg/m    General: Alert, no acute distress, pleasant  Cardio:S1 and S2 present, RRR Pulm: CTAB, normal WOB Abdomen: Bowel sounds normal. Abdomen soft and non-tender.  Extremities: No peripheral edema. Warm/ well perfused.  Neuro: Cranial nerves grossly intact  ASSESSMENT/PLAN:   Type II or unspecified type diabetes mellitus without mention of complication, not stated as uncontrolled Blood glucose still uncontrolled with Humalog and Metformin. Recommend increasing Humalog to 36 units BID. CMP and cholesterol panel obtained today. Discussed option of starting GLP1 agonist or SGLT2 inhibitor and its benefits. Patient would prefer medications which are infrequent injections compared to daily pills. We discussed starting Trulicity and its side effects. Provided patient a hand out on Trulicity and I will call patient with lab results and considering starting this if she feels comfortable. Pt happy with plan. Congratulated patient on her exercise and healthy lifestyle.     Lattie Haw, MD [place]

## 2020-06-07 ENCOUNTER — Other Ambulatory Visit: Payer: Self-pay | Admitting: Family Medicine

## 2020-06-07 ENCOUNTER — Telehealth: Payer: Self-pay | Admitting: Family Medicine

## 2020-06-07 MED ORDER — TRULICITY 0.75 MG/0.5ML ~~LOC~~ SOAJ
0.7500 mg | SUBCUTANEOUS | 0 refills | Status: DC
Start: 2020-06-07 — End: 2020-06-24

## 2020-06-07 NOTE — Telephone Encounter (Signed)
Called patient to inform her of her lab results last week.  Prescribed Trulicity 0.75mg  once weekly she can start as soon as she receives the medication from the pharmacy.  Patient will follow up with me in 2 weeks time for a follow-up of her CBGs and will aim to decrease her Humalog if her CBGs are under control.  Explained to patient that she may message me on my chart to let me know how she is getting on with the new medication.  Patient was happy with the plan.

## 2020-06-24 ENCOUNTER — Encounter: Payer: Self-pay | Admitting: Family Medicine

## 2020-06-24 ENCOUNTER — Other Ambulatory Visit: Payer: Self-pay

## 2020-06-24 ENCOUNTER — Ambulatory Visit (INDEPENDENT_AMBULATORY_CARE_PROVIDER_SITE_OTHER): Payer: Medicare Other | Admitting: Family Medicine

## 2020-06-24 VITALS — BP 126/68 | HR 80 | Wt 221.0 lb

## 2020-06-24 DIAGNOSIS — E119 Type 2 diabetes mellitus without complications: Secondary | ICD-10-CM

## 2020-06-24 LAB — GLUCOSE, POCT (MANUAL RESULT ENTRY): POC Glucose: 332 mg/dl — AB (ref 70–99)

## 2020-06-24 MED ORDER — INSULIN GLARGINE 100 UNIT/ML SOLOSTAR PEN: 10.0000 [IU] | PEN_INJECTOR | SUBCUTANEOUS | 11 refills | Status: AC

## 2020-06-24 MED ORDER — INSULIN LISPRO 100 UNIT/ML ~~LOC~~ SOLN: 20.0000 [IU] | Freq: Every day | SUBCUTANEOUS | 5 refills | Status: AC

## 2020-06-24 NOTE — Progress Notes (Addendum)
    SUBJECTIVE:   CHIEF COMPLAINT / HPI:   Barbara [name] is a 69 yr old female who presents today for diabetes follow up  Diabetes Since clinic 2 weeks ago patient has Trulicity 0.75mg . She didn't know that she should take the metformin 500mg  and humalog 36 units BID too. Tolerated Trulicity well without side effects but is finding it very expensive and her copay was $200 for 1 month supply. Denies blurred vision, abdominal pain, polydipsia or polyuria. Taking CBGs at home which have been 200-300 daily which she is worried about and wants to go back on lantus as this worked previously very well for her.  PERTINENT  PMH / PSH: Diabetes, seasonal allergies, asthma   OBJECTIVE:   BP 126/68   Pulse 80   Wt (!) 221 lb (100.2 kg)   SpO2 98%   BMI 37.93 kg/m    General: Alert, pleasant, no acute distress Cardio: warm and well perfused  Pulm: Normal respiratory effort Neuro: Cranial nerves grossly intact  ASSESSMENT/PLAN:   Type II or unspecified type diabetes mellitus without mention of complication, not stated as uncontrolled Uncontrolled hyperglycemia due to poor compliance and understanding of medications. CBG 323 in clinic today. Precepted patient with Dr Valentina Lucks who recommended simplifying patient's regime due to these concerns and also finding more affordable options for patient. Started patient on vials of humalog which are more affordable starting at 20 units daily and lantus 10 units daily (to increase by 1 unit a day until CBG<150). Medications sent to the pharmacy. Pt expressed understanding. Follow up with me on 8/13 but explained that she may arrange sooner appointment if she wishes.      Lattie Haw, MD [place]

## 2020-06-24 NOTE — Patient Instructions (Addendum)
It was great to see you today. Your blood sugars are still not controlled as well as we would like them. Please start: LANTUS 10 UNITS TOMORROW, then increase by 1 unit every day until blood sugar is below 150. Then continue the SAME dose Take humalog 20 unit ONCE a day  Please discard any other medications. Please call me if you have further questions. Follow up with me on   Best wishes  Dr Posey Pronto

## 2020-06-27 LAB — MICROALBUMIN / CREATININE URINE RATIO
Creatinine, Urine: 68.9 mg/dL
Microalb/Creat Ratio: <4 mg/g creat (ref 0–29)
Microalbumin, Urine: <3 ug/mL

## 2020-06-27 NOTE — Assessment & Plan Note (Addendum)
Uncontrolled hyperglycemia due to poor compliance and understanding of medications. CBG 323 in clinic today. Precepted patient with Dr Valentina Lucks who recommended simplifying patient's regime due to these concerns and also finding more affordable options for patient. Started patient on vials of humalog which are more affordable starting at 20 units daily and lantus 10 units daily (to increase by 1 unit a day until CBG<150). Medications sent to the pharmacy. Pt expressed understanding. Follow up with me on 8/13 but explained that she may arrange sooner appointment if she wishes.

## 2020-06-28 ENCOUNTER — Encounter: Payer: Self-pay | Admitting: Family Medicine

## 2020-07-08 ENCOUNTER — Ambulatory Visit: Payer: Medicare Other

## 2020-07-10 ENCOUNTER — Other Ambulatory Visit: Payer: Self-pay | Admitting: Family Medicine

## 2020-07-15 ENCOUNTER — Other Ambulatory Visit: Payer: Self-pay

## 2020-07-15 ENCOUNTER — Encounter: Payer: Self-pay | Admitting: Allergy

## 2020-07-15 ENCOUNTER — Ambulatory Visit (INDEPENDENT_AMBULATORY_CARE_PROVIDER_SITE_OTHER): Payer: Medicare Other | Admitting: Allergy

## 2020-07-15 ENCOUNTER — Ambulatory Visit: Payer: Medicare Other | Admitting: Allergy

## 2020-07-15 VITALS — BP 124/84 | HR 94 | Resp 16

## 2020-07-15 DIAGNOSIS — L508 Other urticaria: Secondary | ICD-10-CM | POA: Diagnosis not present

## 2020-07-15 NOTE — Patient Instructions (Signed)
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  This leads to the development of chronic and recurrent hives and/or swelling. Lab work also revealed a very low level IgE to ragweed pollen.   -Hives have been controlled with the high-dose antihistamine regimen and you have been able to decrease these medications  -At this time would recommend taking Singulair (Montelukast) at bedtime and Zyrtec 10mg  daily to help with itch  Follow-up 6-65months or sooner if needed

## 2020-07-15 NOTE — Progress Notes (Signed)
Follow-up Note  RE: [place] Mctier MRN: [id number] DOB: March 09, 1951 Date of Office Visit: 07/15/2020   History of present illness: [place] Filyaw is a 69 y.o. female presenting today for follow-up of chronic urticaria she states she has been doing well since this visit without any major health changes, surgeries or hospitalizations.  She states she did stop her antihistamine regimen about a month ago.  However she states she continued to take the Singulair at night.  Since stopping the Zyrtec and the Pepcid she states she has not had any more hives but she has had general itching.  She states when she was on Zyrtec, Pepcid and Singulair together she did not have any itching. She is fully vaccinated with the [place] vaccine.  Review of systems: Review of Systems  Constitutional: Negative.   HENT: Negative.   Eyes: Negative.   Respiratory: Negative.   Cardiovascular: Negative.   Gastrointestinal: Negative.   Musculoskeletal: Negative.   Skin: Positive for itching. Negative for rash.  Neurological: Negative.     All other systems negative unless noted above in HPI  Past medical/social/surgical/family history have been reviewed and are unchanged unless specifically indicated below.  No changes  Medication List: Current Outpatient Medications  Medication Sig Dispense Refill  . aspirin 81 MG tablet Take 81 mg by mouth daily.    . Cholecalciferol (VITAMIN D-3) 1000 UNITS CAPS Take by mouth. Take 3 tablet bid    . glucose blood (ONE TOUCH ULTRA TEST) test strip by Misc.(Non-Drug; Combo Route) route.    Marland Kitchen HUMALOG MIX 50/50 KWIKPEN (50-50) 100 UNIT/ML Kwikpen Inject into the skin.    Marland Kitchen insulin glargine (LANTUS) 100 UNIT/ML Solostar Pen Inject 10 Units into the skin every morning. increase by 1 unit every day until blood sugar is below 150. Then continue the SAME dose 3 mL 11  . insulin lispro (HUMALOG) 100 UNIT/ML injection Inject 0.2 mLs (20 Units total) into the skin daily. 10 mL 5  .  Magnesium 250 MG TABS Take by mouth.    . Omega-3 1000 MG CAPS Take by mouth.     No current facility-administered medications for this visit.     Known medication allergies: Allergies  Allergen Reactions  . Canagliflozin Anxiety and Palpitations    Infections     Physical examination: Blood pressure 124/84, pulse 94, resp. rate 16, SpO2 96 %.  General: Alert, interactive, in no acute distress. HEENT: PERRLA, TMs pearly gray, turbinates non-edematous without discharge, post-pharynx non erythematous. Neck: Supple without lymphadenopathy. Lungs: Clear to auscultation without wheezing, rhonchi or rales. {no increased work of breathing. CV: Normal S1, S2 without murmurs. Abdomen: Nondistended, nontender. Skin: Warm and dry, without lesions or rashes. Extremities:  No clubbing, cyanosis or edema. Neuro:   Grossly intact.  Diagnositics/Labs: None today   Assessment and plan: Chronic urticaria   -Etiology of hives is Autoimmune in nature as chronic urticaria index was positive which indicates that you make an antibody that can target and activate your allergy cells (mast cells).  This leads to the development of chronic and recurrent hives and/or swelling. Lab work also revealed a very low level IgE to ragweed pollen.   -Hives have been controlled with the high-dose antihistamine regimen and you have been able to decrease these medications  -At this time would recommend taking Singulair (Montelukast) at bedtime and Zyrtec 10mg  daily to help with itch  Follow-up 6-64months or sooner if needed  I appreciate the opportunity to take part in [place] care.  Please do not hesitate to contact me with questions.  Sincerely,   Prudy Feeler, MD Allergy/Immunology [facility]

## 2020-08-11 ENCOUNTER — Other Ambulatory Visit: Payer: Self-pay | Admitting: Allergy

## 2020-08-24 ENCOUNTER — Ambulatory Visit: Payer: Medicare Other | Admitting: Allergy

## 2021-02-10 ENCOUNTER — Other Ambulatory Visit: Payer: Self-pay | Admitting: Allergy

## 2021-04-13 ENCOUNTER — Other Ambulatory Visit: Payer: Self-pay | Admitting: Allergy

## 2021-07-14 ENCOUNTER — Ambulatory Visit: Payer: Medicare Other | Admitting: Allergy

## 2022-05-01 ENCOUNTER — Encounter: Payer: Self-pay | Admitting: *Deleted
